# Patient Record
Sex: Male | Born: 1940 | Race: White | Hispanic: No | Smoking: Never smoker
Health system: Southern US, Community
[De-identification: ages and names within clinical notes are randomized; demographics above are authoritative.]

## PROBLEM LIST (undated history)

## (undated) DIAGNOSIS — I10 Essential (primary) hypertension: Secondary | ICD-10-CM

## (undated) DIAGNOSIS — M1990 Unspecified osteoarthritis, unspecified site: Secondary | ICD-10-CM

## (undated) DIAGNOSIS — R0683 Snoring: Secondary | ICD-10-CM

## (undated) DIAGNOSIS — K259 Gastric ulcer, unspecified as acute or chronic, without hemorrhage or perforation: Secondary | ICD-10-CM

## (undated) DIAGNOSIS — E119 Type 2 diabetes mellitus without complications: Secondary | ICD-10-CM

## (undated) DIAGNOSIS — M109 Gout, unspecified: Secondary | ICD-10-CM

## (undated) HISTORY — PX: ESOPHAGOGASTRODUODENOSCOPY: SHX1529

## (undated) HISTORY — PX: TONSILLECTOMY: SUR1361

## (undated) HISTORY — PX: SHOULDER SURGERY: SHX246

## (undated) HISTORY — PX: COLONOSCOPY W/ POLYPECTOMY: SHX1380

## (undated) HISTORY — PX: KNEE ARTHROPLASTY: SHX992

## (undated) HISTORY — PX: OTHER SURGICAL HISTORY: SHX169

---

## 2014-09-19 ENCOUNTER — Other Ambulatory Visit: Payer: Self-pay | Admitting: Orthopedic Surgery

## 2014-09-28 ENCOUNTER — Inpatient Hospital Stay (HOSPITAL_COMMUNITY)
Admission: RE | Admit: 2014-09-28 | Discharge: 2014-09-28 | Disposition: A | Discharge status: Home / Self Care | Payer: Self-pay | Source: Ambulatory Visit

## 2014-09-28 NOTE — Progress Notes (Signed)
Nurse called patient to inquire about scheduled 1300 PAT appointment. No answer. Direct call back number left.

## 2014-09-28 NOTE — Progress Notes (Signed)
Patient returned call and stated that he was not aware that he had a appointment today. Nurse [facility], multimedia(PAT secretary) to reschedule.

## 2014-09-29 ENCOUNTER — Encounter (HOSPITAL_COMMUNITY): Payer: Self-pay

## 2014-09-29 ENCOUNTER — Encounter (HOSPITAL_COMMUNITY)
Admission: RE | Admit: 2014-09-29 | Discharge: 2014-09-29 | Disposition: A | Discharge status: Home / Self Care | Payer: Medicare Other | Source: Ambulatory Visit | Attending: Orthopedic Surgery | Admitting: Orthopedic Surgery

## 2014-09-29 ENCOUNTER — Ambulatory Visit (HOSPITAL_COMMUNITY)
Admission: RE | Admit: 2014-09-29 | Discharge: 2014-09-29 | Disposition: A | Discharge status: Home / Self Care | Payer: Medicare Other | Source: Ambulatory Visit | Attending: Orthopedic Surgery | Admitting: Orthopedic Surgery

## 2014-09-29 DIAGNOSIS — Z01818 Encounter for other preprocedural examination: Secondary | ICD-10-CM

## 2014-09-29 HISTORY — DX: Gout, unspecified: M10.9

## 2014-09-29 HISTORY — DX: Unspecified osteoarthritis, unspecified site: M19.90

## 2014-09-29 HISTORY — DX: Gastric ulcer, unspecified as acute or chronic, without hemorrhage or perforation: K25.9

## 2014-09-29 HISTORY — DX: Essential (primary) hypertension: I10

## 2014-09-29 HISTORY — DX: Type 2 diabetes mellitus without complications: E11.9

## 2014-09-29 HISTORY — DX: Snoring: R06.83

## 2014-09-29 LAB — COMPREHENSIVE METABOLIC PANEL
ALBUMIN: 3.8 g/dL (ref 3.5–5.0)
ALT: 65 U/L — ABNORMAL HIGH (ref 17–63)
AST: 48 U/L — AB (ref 15–41)
Alkaline Phosphatase: 55 U/L (ref 38–126)
Anion gap: 11 (ref 5–15)
BILIRUBIN TOTAL: 1.5 mg/dL — AB (ref 0.3–1.2)
BUN: 23 mg/dL — ABNORMAL HIGH (ref 6–20)
CHLORIDE: 99 mmol/L — AB (ref 101–111)
CO2: 22 mmol/L (ref 22–32)
Calcium: 8.6 mg/dL — ABNORMAL LOW (ref 8.9–10.3)
Creatinine, Ser: 1.22 mg/dL (ref 0.61–1.24)
GFR, EST NON AFRICAN AMERICAN: 56 mL/min — AB (ref >60)
GLUCOSE: 348 mg/dL — AB (ref 65–99)
Potassium: 5.3 mmol/L — ABNORMAL HIGH (ref 3.5–5.1)
Sodium: 132 mmol/L — ABNORMAL LOW (ref 135–145)
Total Protein: 7 g/dL (ref 6.5–8.1)

## 2014-09-29 LAB — CBC WITH DIFFERENTIAL/PLATELET
BASOS PCT: 0 % (ref 0–1)
Basophils Absolute: 0 10*3/uL (ref 0.0–0.1)
EOS ABS: 0.4 10*3/uL (ref 0.0–0.7)
EOS PCT: 4 % (ref 0–5)
HEMATOCRIT: 40.3 % (ref 39.0–52.0)
Hemoglobin: 14.3 g/dL (ref 13.0–17.0)
Lymphocytes Relative: 20 % (ref 12–46)
Lymphs Abs: 1.9 10*3/uL (ref 0.7–4.0)
MCH: 32.4 pg (ref 26.0–34.0)
MCHC: 35.5 g/dL (ref 30.0–36.0)
MCV: 91.4 fL (ref 78.0–100.0)
MONO ABS: 0.8 10*3/uL (ref 0.1–1.0)
Monocytes Relative: 8 % (ref 3–12)
NEUTROS ABS: 6.6 10*3/uL (ref 1.7–7.7)
Neutrophils Relative %: 68 % (ref 43–77)
PLATELETS: ADEQUATE 10*3/uL (ref 150–400)
RBC: 4.41 MIL/uL (ref 4.22–5.81)
RDW: 12.7 % (ref 11.5–15.5)
WBC: 9.7 10*3/uL (ref 4.0–10.5)

## 2014-09-29 LAB — SURGICAL PCR SCREEN
MRSA, PCR: NEGATIVE
STAPHYLOCOCCUS AUREUS: NEGATIVE

## 2014-09-29 LAB — APTT: aPTT: 26 seconds (ref 24–37)

## 2014-09-29 LAB — URINE MICROSCOPIC-ADD ON

## 2014-09-29 LAB — URINALYSIS, ROUTINE W REFLEX MICROSCOPIC
Bilirubin Urine: NEGATIVE
Glucose, UA: >1000 mg/dL — AB
Hgb urine dipstick: NEGATIVE
Ketones, ur: NEGATIVE mg/dL
Leukocytes, UA: NEGATIVE
NITRITE: NEGATIVE
Protein, ur: NEGATIVE mg/dL
SPECIFIC GRAVITY, URINE: 1.029 (ref 1.005–1.030)
UROBILINOGEN UA: 0.2 mg/dL (ref 0.0–1.0)
pH: 5.5 (ref 5.0–8.0)

## 2014-09-29 LAB — PROTIME-INR
INR: 1.04 (ref 0.00–1.49)
PROTHROMBIN TIME: 13.7 s (ref 11.6–15.2)

## 2014-09-29 NOTE — Progress Notes (Signed)
PCP is Kathlee NationsPaul Eason and Cardiologist is Winifred Oliveaul Kirkman. Patient informed Nurse that he had a  EKG done this month with D.r Cory RoughenKirkman. Will request EKG, stress test results, and LOV. Patient denied having any acute cardiac or pulmonary issues.   Patient informed Nurse that his fasting blood glucose ranges between 60s to 170s, and his last hemoglobin A1C was 7.0.

## 2014-09-30 LAB — URINE CULTURE
COLONY COUNT: NO GROWTH
Culture: NO GROWTH

## 2014-10-02 NOTE — Progress Notes (Addendum)
Anesthesia Chart Review:  Pt is 74 year old male scheduled for R total knee arthroplasty on 10/09/2014 with Dr. Sherlean FootLucey.   PCP is Dr. Kathlee NationsPaul Eason. Cardiologist is [facility] (see care everywhere).   PMH includes: HTN, DM. By notes, mild carotid plaque bilaterally. Never smoker. BMI 33.  Preoperative labs reviewed.  Glucose 348. Pt reported at PAT fasting blood sugars usually range 60-170's, and last HgbA1c was 7.0 (03/10/2014). Spoke with pt on telephone, today's fasting blood sugar was 155. Pt reports is taking all meds and will work harder on diet. Let pt know surgery could be cancelled if blood sugar >200 DOS.   Chest x-ray reviewed. No active cardiopulmonary disease.   EKG 09/15/2014: Sinus rhythm.    Pt has cardiac clearance from Dr. Cory RoughenKirkman for surgery.   If blood glucose acceptable DOS, I anticipate pt can proceed as scheduled.   Rica Mastngela Akram Kissick, FNP-BC [facility]Anesthesiology Phone: [phone] 10/03/2014 3:43 PM

## 2014-10-06 MED ORDER — TRANEXAMIC ACID 1000 MG/10ML IV SOLN
1000.0000 mg | INTRAVENOUS | Status: AC
  Administered 2014-10-09: 1000 mg via INTRAVENOUS
  Filled 2014-10-06 (×2): qty 10

## 2014-10-06 MED ORDER — BUPIVACAINE LIPOSOME 1.3 % IJ SUSP
20.0000 mL | Freq: Once | INTRAMUSCULAR | Status: DC
  Filled 2014-10-06 (×2): qty 20

## 2014-10-09 ENCOUNTER — Inpatient Hospital Stay (HOSPITAL_COMMUNITY): Payer: Medicare Other | Admitting: Emergency Medicine

## 2014-10-09 ENCOUNTER — Inpatient Hospital Stay (HOSPITAL_COMMUNITY)
Admission: RE | Admit: 2014-10-09 | Discharge: 2014-10-11 | DRG: 470 | Disposition: A | Discharge status: Home Health | Payer: Medicare Other | Source: Ambulatory Visit | Attending: Orthopedic Surgery | Admitting: Orthopedic Surgery

## 2014-10-09 ENCOUNTER — Inpatient Hospital Stay (HOSPITAL_COMMUNITY): Payer: Medicare Other | Admitting: Certified Registered Nurse Anesthetist

## 2014-10-09 ENCOUNTER — Encounter (HOSPITAL_COMMUNITY)
Admission: RE | Disposition: A | Discharge status: Home Health | Payer: Self-pay | Source: Ambulatory Visit | Attending: Orthopedic Surgery

## 2014-10-09 ENCOUNTER — Encounter (HOSPITAL_COMMUNITY): Payer: Self-pay | Admitting: General Practice

## 2014-10-09 DIAGNOSIS — D62 Acute posthemorrhagic anemia: Secondary | ICD-10-CM | POA: Diagnosis not present

## 2014-10-09 DIAGNOSIS — Z96659 Presence of unspecified artificial knee joint: Secondary | ICD-10-CM

## 2014-10-09 DIAGNOSIS — M109 Gout, unspecified: Secondary | ICD-10-CM | POA: Diagnosis present

## 2014-10-09 DIAGNOSIS — Z7982 Long term (current) use of aspirin: Secondary | ICD-10-CM

## 2014-10-09 DIAGNOSIS — E119 Type 2 diabetes mellitus without complications: Secondary | ICD-10-CM | POA: Diagnosis present

## 2014-10-09 DIAGNOSIS — M1711 Unilateral primary osteoarthritis, right knee: Principal | ICD-10-CM | POA: Diagnosis present

## 2014-10-09 DIAGNOSIS — I10 Essential (primary) hypertension: Secondary | ICD-10-CM | POA: Diagnosis present

## 2014-10-09 DIAGNOSIS — Z96652 Presence of left artificial knee joint: Secondary | ICD-10-CM | POA: Diagnosis present

## 2014-10-09 DIAGNOSIS — M25561 Pain in right knee: Secondary | ICD-10-CM | POA: Diagnosis present

## 2014-10-09 HISTORY — PX: TOTAL KNEE ARTHROPLASTY: SHX125

## 2014-10-09 LAB — GLUCOSE, CAPILLARY
GLUCOSE-CAPILLARY: 213 mg/dL — AB (ref 65–99)
GLUCOSE-CAPILLARY: 199 mg/dL — AB (ref 65–99)
GLUCOSE-CAPILLARY: 266 mg/dL — AB (ref 65–99)

## 2014-10-09 SURGERY — ARTHROPLASTY, KNEE, TOTAL
Anesthesia: Regional | Site: Knee | Laterality: Right

## 2014-10-09 MED ORDER — SODIUM CHLORIDE 0.9 % IR SOLN
Status: DC | PRN
  Administered 2014-10-09: 1000 mL

## 2014-10-09 MED ORDER — ALUM & MAG HYDROXIDE-SIMETH 200-200-20 MG/5ML PO SUSP: 30.0000 mL | ORAL | Status: DC | PRN

## 2014-10-09 MED ORDER — PANTOPRAZOLE SODIUM 40 MG PO TBEC
40.0000 mg | DELAYED_RELEASE_TABLET | Freq: Every day | ORAL | Status: DC
Start: 2014-10-10 — End: 2014-10-11
  Administered 2014-10-10 – 2014-10-11 (×2): 40 mg via ORAL
  Filled 2014-10-09 (×2): qty 1

## 2014-10-09 MED ORDER — METHOCARBAMOL 500 MG PO TABS
500.0000 mg | ORAL_TABLET | Freq: Four times a day (QID) | ORAL | Status: DC | PRN
  Administered 2014-10-09 – 2014-10-10 (×2): 500 mg via ORAL
  Filled 2014-10-09 (×2): qty 1

## 2014-10-09 MED ORDER — SIMVASTATIN 10 MG PO TABS
10.0000 mg | ORAL_TABLET | Freq: Every day | ORAL | Status: DC
  Administered 2014-10-09 – 2014-10-10 (×2): 10 mg via ORAL
  Filled 2014-10-09 (×2): qty 1

## 2014-10-09 MED ORDER — PROBENECID 500 MG PO TABS
500.0000 mg | ORAL_TABLET | Freq: Every day | ORAL | Status: DC
  Administered 2014-10-10 – 2014-10-11 (×2): 500 mg via ORAL
  Filled 2014-10-09 (×2): qty 1

## 2014-10-09 MED ORDER — ACETAMINOPHEN 325 MG PO TABS
650.0000 mg | ORAL_TABLET | Freq: Four times a day (QID) | ORAL | Status: DC | PRN
  Administered 2014-10-09: 650 mg via ORAL
  Filled 2014-10-09 (×2): qty 2

## 2014-10-09 MED ORDER — PROPOFOL 10 MG/ML IV BOLUS
INTRAVENOUS | Status: AC
  Filled 2014-10-09: qty 20

## 2014-10-09 MED ORDER — ACETAMINOPHEN 650 MG RE SUPP
650.0000 mg | Freq: Four times a day (QID) | RECTAL | Status: DC | PRN
  Filled 2014-10-09: qty 1

## 2014-10-09 MED ORDER — BUPIVACAINE LIPOSOME 1.3 % IJ SUSP
INTRAMUSCULAR | Status: DC | PRN
  Administered 2014-10-09: 20 mL

## 2014-10-09 MED ORDER — FENTANYL CITRATE (PF) 250 MCG/5ML IJ SOLN
INTRAMUSCULAR | Status: AC
  Filled 2014-10-09: qty 5

## 2014-10-09 MED ORDER — METOCLOPRAMIDE HCL 5 MG PO TABS: 5.0000 mg | ORAL_TABLET | Freq: Three times a day (TID) | ORAL | Status: DC | PRN

## 2014-10-09 MED ORDER — PHENYLEPHRINE HCL 10 MG/ML IJ SOLN
INTRAMUSCULAR | Status: DC | PRN
  Administered 2014-10-09 (×6): 40 ug via INTRAVENOUS
  Administered 2014-10-09: 80 ug via INTRAVENOUS

## 2014-10-09 MED ORDER — OXYCODONE HCL 5 MG PO TABS
5.0000 mg | ORAL_TABLET | ORAL | Status: DC | PRN
  Administered 2014-10-09: 10 mg via ORAL
  Administered 2014-10-10: 5 mg via ORAL
  Administered 2014-10-10: 10 mg via ORAL
  Filled 2014-10-09 (×3): qty 2

## 2014-10-09 MED ORDER — ONDANSETRON HCL 4 MG/2ML IJ SOLN
INTRAMUSCULAR | Status: AC
  Administered 2014-10-09: 4 mg via INTRAVENOUS
  Filled 2014-10-09: qty 2

## 2014-10-09 MED ORDER — OXYCODONE HCL 5 MG PO TABS
ORAL_TABLET | ORAL | Status: AC
  Filled 2014-10-09: qty 2

## 2014-10-09 MED ORDER — CEFAZOLIN SODIUM-DEXTROSE 2-3 GM-% IV SOLR
2.0000 g | Freq: Four times a day (QID) | INTRAVENOUS | Status: AC
  Administered 2014-10-09 (×2): 2 g via INTRAVENOUS
  Filled 2014-10-09: qty 50

## 2014-10-09 MED ORDER — ONDANSETRON HCL 4 MG/2ML IJ SOLN
INTRAMUSCULAR | Status: AC
  Filled 2014-10-09: qty 2

## 2014-10-09 MED ORDER — CEFAZOLIN SODIUM-DEXTROSE 2-3 GM-% IV SOLR
INTRAVENOUS | Status: AC
  Administered 2014-10-09: 2 g via INTRAVENOUS
  Filled 2014-10-09: qty 50

## 2014-10-09 MED ORDER — ARTIFICIAL TEARS OP OINT
TOPICAL_OINTMENT | OPHTHALMIC | Status: AC
  Filled 2014-10-09: qty 3.5

## 2014-10-09 MED ORDER — BISACODYL 5 MG PO TBEC: 5.0000 mg | DELAYED_RELEASE_TABLET | Freq: Every day | ORAL | Status: DC | PRN

## 2014-10-09 MED ORDER — FENTANYL CITRATE (PF) 100 MCG/2ML IJ SOLN
INTRAMUSCULAR | Status: AC
  Filled 2014-10-09: qty 2

## 2014-10-09 MED ORDER — LIDOCAINE HCL (CARDIAC) 20 MG/ML IV SOLN
INTRAVENOUS | Status: DC | PRN
  Administered 2014-10-09: 40 mg via INTRAVENOUS

## 2014-10-09 MED ORDER — OXYCODONE HCL ER 10 MG PO T12A
10.0000 mg | EXTENDED_RELEASE_TABLET | Freq: Two times a day (BID) | ORAL | Status: DC
  Administered 2014-10-09 – 2014-10-11 (×4): 10 mg via ORAL
  Filled 2014-10-09 (×6): qty 1

## 2014-10-09 MED ORDER — STERILE WATER FOR INJECTION IJ SOLN
INTRAMUSCULAR | Status: AC
  Filled 2014-10-09: qty 10

## 2014-10-09 MED ORDER — CHLORHEXIDINE GLUCONATE 4 % EX LIQD
60.0000 mL | Freq: Once | CUTANEOUS | Status: DC
  Filled 2014-10-09: qty 60

## 2014-10-09 MED ORDER — DIPHENHYDRAMINE HCL 12.5 MG/5ML PO ELIX: 12.5000 mg | ORAL_SOLUTION | ORAL | Status: DC | PRN

## 2014-10-09 MED ORDER — SENNOSIDES-DOCUSATE SODIUM 8.6-50 MG PO TABS: 1.0000 | ORAL_TABLET | Freq: Every evening | ORAL | Status: DC | PRN

## 2014-10-09 MED ORDER — LIDOCAINE HCL (CARDIAC) 20 MG/ML IV SOLN
INTRAVENOUS | Status: AC
  Filled 2014-10-09: qty 10

## 2014-10-09 MED ORDER — DEXAMETHASONE SODIUM PHOSPHATE 4 MG/ML IJ SOLN
INTRAMUSCULAR | Status: AC
  Filled 2014-10-09: qty 1

## 2014-10-09 MED ORDER — EPHEDRINE SULFATE 50 MG/ML IJ SOLN
INTRAMUSCULAR | Status: DC | PRN
  Administered 2014-10-09 (×3): 5 mg via INTRAVENOUS

## 2014-10-09 MED ORDER — METHOCARBAMOL 1000 MG/10ML IJ SOLN
500.0000 mg | Freq: Four times a day (QID) | INTRAVENOUS | Status: DC | PRN
  Filled 2014-10-09: qty 5

## 2014-10-09 MED ORDER — QUINAPRIL HCL 10 MG PO TABS
40.0000 mg | ORAL_TABLET | Freq: Two times a day (BID) | ORAL | Status: DC
  Administered 2014-10-09 – 2014-10-11 (×4): 40 mg via ORAL
  Filled 2014-10-09 (×5): qty 4

## 2014-10-09 MED ORDER — AMLODIPINE BESYLATE 2.5 MG PO TABS
2.5000 mg | ORAL_TABLET | Freq: Every day | ORAL | Status: DC
  Administered 2014-10-10 – 2014-10-11 (×2): 2.5 mg via ORAL
  Filled 2014-10-09 (×2): qty 1

## 2014-10-09 MED ORDER — METOCLOPRAMIDE HCL 5 MG/ML IJ SOLN: 5.0000 mg | Freq: Three times a day (TID) | INTRAMUSCULAR | Status: DC | PRN

## 2014-10-09 MED ORDER — EPHEDRINE SULFATE 50 MG/ML IJ SOLN
INTRAMUSCULAR | Status: AC
  Filled 2014-10-09: qty 1

## 2014-10-09 MED ORDER — DOCUSATE SODIUM 100 MG PO CAPS
100.0000 mg | ORAL_CAPSULE | Freq: Two times a day (BID) | ORAL | Status: DC
  Administered 2014-10-09 – 2014-10-11 (×4): 100 mg via ORAL
  Filled 2014-10-09 (×4): qty 1

## 2014-10-09 MED ORDER — ZOLPIDEM TARTRATE 5 MG PO TABS: 5.0000 mg | ORAL_TABLET | Freq: Every evening | ORAL | Status: DC | PRN

## 2014-10-09 MED ORDER — BUPIVACAINE-EPINEPHRINE 0.5% -1:200000 IJ SOLN
INTRAMUSCULAR | Status: DC | PRN
  Administered 2014-10-09: 30 mL

## 2014-10-09 MED ORDER — ACETAMINOPHEN 325 MG PO TABS
ORAL_TABLET | ORAL | Status: AC
  Filled 2014-10-09: qty 2

## 2014-10-09 MED ORDER — METHOCARBAMOL 500 MG PO TABS
ORAL_TABLET | ORAL | Status: AC
  Filled 2014-10-09: qty 1

## 2014-10-09 MED ORDER — MENTHOL 3 MG MT LOZG: 1.0000 | LOZENGE | OROMUCOSAL | Status: DC | PRN

## 2014-10-09 MED ORDER — CEFAZOLIN SODIUM-DEXTROSE 2-3 GM-% IV SOLR
2.0000 g | INTRAVENOUS | Status: AC
  Administered 2014-10-09: 2 g via INTRAVENOUS
  Filled 2014-10-09: qty 50

## 2014-10-09 MED ORDER — SODIUM CHLORIDE 0.9 % IV SOLN
INTRAVENOUS | Status: DC
  Administered 2014-10-09 – 2014-10-10 (×2): via INTRAVENOUS

## 2014-10-09 MED ORDER — PHENOL 1.4 % MT LIQD: 1.0000 | OROMUCOSAL | Status: DC | PRN

## 2014-10-09 MED ORDER — PHENYLEPHRINE 40 MCG/ML (10ML) SYRINGE FOR IV PUSH (FOR BLOOD PRESSURE SUPPORT)
PREFILLED_SYRINGE | INTRAVENOUS | Status: AC
  Filled 2014-10-09: qty 10

## 2014-10-09 MED ORDER — GLIMEPIRIDE 4 MG PO TABS
2.0000 mg | ORAL_TABLET | Freq: Every day | ORAL | Status: DC
Start: 2014-10-10 — End: 2014-10-11
  Administered 2014-10-10 – 2014-10-11 (×2): 2 mg via ORAL
  Filled 2014-10-09 (×2): qty 1

## 2014-10-09 MED ORDER — FLEET ENEMA 7-19 GM/118ML RE ENEM
1.0000 | ENEMA | Freq: Once | RECTAL | Status: AC | PRN
Start: 2014-10-09 — End: 2014-10-09

## 2014-10-09 MED ORDER — 0.9 % SODIUM CHLORIDE (POUR BTL) OPTIME
TOPICAL | Status: DC | PRN
  Administered 2014-10-09: 1000 mL

## 2014-10-09 MED ORDER — HYDROMORPHONE HCL 1 MG/ML IJ SOLN
INTRAMUSCULAR | Status: AC
  Administered 2014-10-09: 1 mg via INTRAVENOUS
  Filled 2014-10-09: qty 1

## 2014-10-09 MED ORDER — CELECOXIB 200 MG PO CAPS
200.0000 mg | ORAL_CAPSULE | Freq: Two times a day (BID) | ORAL | Status: DC
  Administered 2014-10-09 – 2014-10-11 (×4): 200 mg via ORAL
  Filled 2014-10-09 (×4): qty 1

## 2014-10-09 MED ORDER — SODIUM CHLORIDE 0.9 % IV SOLN: INTRAVENOUS | Status: DC

## 2014-10-09 MED ORDER — FENTANYL CITRATE (PF) 100 MCG/2ML IJ SOLN
100.0000 ug | Freq: Once | INTRAMUSCULAR | Status: AC
  Administered 2014-10-09: 100 ug via INTRAVENOUS

## 2014-10-09 MED ORDER — HYDROCHLOROTHIAZIDE 25 MG PO TABS
25.0000 mg | ORAL_TABLET | Freq: Every day | ORAL | Status: DC
  Administered 2014-10-10 – 2014-10-11 (×2): 25 mg via ORAL
  Filled 2014-10-09 (×2): qty 1

## 2014-10-09 MED ORDER — ENOXAPARIN SODIUM 30 MG/0.3ML ~~LOC~~ SOLN
30.0000 mg | Freq: Two times a day (BID) | SUBCUTANEOUS | Status: DC
  Administered 2014-10-10 – 2014-10-11 (×3): 30 mg via SUBCUTANEOUS
  Filled 2014-10-09 (×3): qty 0.3

## 2014-10-09 MED ORDER — LACTATED RINGERS IV SOLN
INTRAVENOUS | Status: DC
  Administered 2014-10-09 (×2): via INTRAVENOUS

## 2014-10-09 MED ORDER — SODIUM CHLORIDE 0.9 % IV SOLN: 1000.0000 mg | Freq: Once | INTRAVENOUS | Status: DC

## 2014-10-09 MED ORDER — HYDROMORPHONE HCL 1 MG/ML IJ SOLN
1.0000 mg | INTRAMUSCULAR | Status: DC | PRN
  Administered 2014-10-09 – 2014-10-10 (×4): 1 mg via INTRAVENOUS
  Filled 2014-10-09 (×2): qty 1

## 2014-10-09 MED ORDER — METFORMIN HCL ER 500 MG PO TB24
500.0000 mg | ORAL_TABLET | Freq: Every day | ORAL | Status: DC
  Administered 2014-10-10 – 2014-10-11 (×2): 500 mg via ORAL
  Filled 2014-10-09 (×2): qty 1

## 2014-10-09 MED ORDER — PROPOFOL INFUSION 10 MG/ML OPTIME
INTRAVENOUS | Status: DC | PRN
  Administered 2014-10-09: 50 ug/kg/min via INTRAVENOUS

## 2014-10-09 MED ORDER — DUTASTERIDE 0.5 MG PO CAPS
0.5000 mg | ORAL_CAPSULE | Freq: Every day | ORAL | Status: DC
  Administered 2014-10-10 – 2014-10-11 (×2): 0.5 mg via ORAL
  Filled 2014-10-09 (×2): qty 1

## 2014-10-09 MED ORDER — SUCCINYLCHOLINE CHLORIDE 20 MG/ML IJ SOLN
INTRAMUSCULAR | Status: AC
  Filled 2014-10-09: qty 1

## 2014-10-09 MED ORDER — ONDANSETRON HCL 4 MG PO TABS: 4.0000 mg | ORAL_TABLET | Freq: Four times a day (QID) | ORAL | Status: DC | PRN

## 2014-10-09 MED ORDER — FENTANYL CITRATE (PF) 100 MCG/2ML IJ SOLN
INTRAMUSCULAR | Status: DC | PRN
  Administered 2014-10-09: 50 ug via INTRAVENOUS

## 2014-10-09 MED ORDER — ONDANSETRON HCL 4 MG/2ML IJ SOLN
INTRAMUSCULAR | Status: DC | PRN
  Administered 2014-10-09: 4 mg via INTRAVENOUS

## 2014-10-09 MED ORDER — HYDROMORPHONE HCL 1 MG/ML IJ SOLN
INTRAMUSCULAR | Status: AC
  Filled 2014-10-09: qty 1

## 2014-10-09 MED ORDER — ONDANSETRON HCL 4 MG/2ML IJ SOLN
4.0000 mg | Freq: Four times a day (QID) | INTRAMUSCULAR | Status: DC | PRN
  Administered 2014-10-09: 4 mg via INTRAVENOUS

## 2014-10-09 MED ORDER — INSULIN ASPART 100 UNIT/ML ~~LOC~~ SOLN
0.0000 [IU] | Freq: Three times a day (TID) | SUBCUTANEOUS | Status: DC
  Administered 2014-10-10 (×2): 3 [IU] via SUBCUTANEOUS
  Administered 2014-10-10: 5 [IU] via SUBCUTANEOUS
  Administered 2014-10-11: 8 [IU] via SUBCUTANEOUS

## 2014-10-09 SURGICAL SUPPLY — 62 items
BANDAGE ELASTIC 6 VELCRO ST LF (GAUZE/BANDAGES/DRESSINGS) ×2 IMPLANT
BANDAGE ESMARK 6X9 LF (GAUZE/BANDAGES/DRESSINGS) ×1 IMPLANT
BLADE SAGITTAL 13X1.27X60 (BLADE) ×2 IMPLANT
BLADE SAW SGTL 83.5X18.5 (BLADE) ×2 IMPLANT
BLADE SURG 10 STRL SS (BLADE) ×2 IMPLANT
BNDG ESMARK 6X9 LF (GAUZE/BANDAGES/DRESSINGS) ×2
BOWL SMART MIX CTS (DISPOSABLE) ×2 IMPLANT
CAPT KNEE TOTAL 3 ×2 IMPLANT
CEMENT BONE SIMPLEX SPEEDSET (Cement) ×4 IMPLANT
COVER SURGICAL LIGHT HANDLE (MISCELLANEOUS) ×2 IMPLANT
CUFF TOURNIQUET SINGLE 34IN LL (TOURNIQUET CUFF) ×2 IMPLANT
DRAPE EXTREMITY T 121X128X90 (DRAPE) ×2 IMPLANT
DRAPE IMP U-DRAPE 54X76 (DRAPES) ×2 IMPLANT
DRAPE INCISE IOBAN 66X45 STRL (DRAPES) ×4 IMPLANT
DRAPE PROXIMA HALF (DRAPES) IMPLANT
DRAPE U-SHAPE 47X51 STRL (DRAPES) ×2 IMPLANT
DRSG ADAPTIC 3X8 NADH LF (GAUZE/BANDAGES/DRESSINGS) ×2 IMPLANT
DRSG PAD ABDOMINAL 8X10 ST (GAUZE/BANDAGES/DRESSINGS) ×2 IMPLANT
DURAPREP 26ML APPLICATOR (WOUND CARE) ×4 IMPLANT
ELECT REM PT RETURN 9FT ADLT (ELECTROSURGICAL) ×2
ELECTRODE REM PT RTRN 9FT ADLT (ELECTROSURGICAL) ×1 IMPLANT
GAUZE SPONGE 4X4 12PLY STRL (GAUZE/BANDAGES/DRESSINGS) ×2 IMPLANT
GLOVE BIOGEL M 7.0 STRL (GLOVE) IMPLANT
GLOVE BIOGEL PI IND STRL 6.5 (GLOVE) ×1 IMPLANT
GLOVE BIOGEL PI IND STRL 7.5 (GLOVE) IMPLANT
GLOVE BIOGEL PI IND STRL 8.5 (GLOVE) ×2 IMPLANT
GLOVE BIOGEL PI INDICATOR 6.5 (GLOVE) ×1
GLOVE BIOGEL PI INDICATOR 7.5 (GLOVE)
GLOVE BIOGEL PI INDICATOR 8.5 (GLOVE) ×2
GLOVE SURG ORTHO 8.0 STRL STRW (GLOVE) ×4 IMPLANT
GLOVE SURG SS PI 6.5 STRL IVOR (GLOVE) ×2 IMPLANT
GOWN STRL REUS W/ TWL LRG LVL3 (GOWN DISPOSABLE) ×1 IMPLANT
GOWN STRL REUS W/ TWL XL LVL3 (GOWN DISPOSABLE) ×2 IMPLANT
GOWN STRL REUS W/TWL LRG LVL3 (GOWN DISPOSABLE) ×1
GOWN STRL REUS W/TWL XL LVL3 (GOWN DISPOSABLE) ×2
HANDPIECE INTERPULSE COAX TIP (DISPOSABLE) ×1
HOOD PEEL AWAY FACE SHEILD DIS (HOOD) ×6 IMPLANT
KIT BASIN OR (CUSTOM PROCEDURE TRAY) ×2 IMPLANT
KIT ROOM TURNOVER OR (KITS) ×2 IMPLANT
KNEE CAPITATED TOTAL 3 ×1 IMPLANT
MANIFOLD NEPTUNE II (INSTRUMENTS) ×2 IMPLANT
NEEDLE 22X1 1/2 (OR ONLY) (NEEDLE) ×4 IMPLANT
NS IRRIG 1000ML POUR BTL (IV SOLUTION) ×2 IMPLANT
PACK TOTAL JOINT (CUSTOM PROCEDURE TRAY) ×2 IMPLANT
PACK UNIVERSAL I (CUSTOM PROCEDURE TRAY) ×2 IMPLANT
PAD ARMBOARD 7.5X6 YLW CONV (MISCELLANEOUS) ×4 IMPLANT
PADDING CAST ABS 6INX4YD NS (CAST SUPPLIES) ×1
PADDING CAST ABS COTTON 6X4 NS (CAST SUPPLIES) ×1 IMPLANT
PADDING CAST COTTON 6X4 STRL (CAST SUPPLIES) ×2 IMPLANT
SET HNDPC FAN SPRY TIP SCT (DISPOSABLE) ×1 IMPLANT
STAPLER VISISTAT 35W (STAPLE) ×2 IMPLANT
SUCTION FRAZIER TIP 10 FR DISP (SUCTIONS) ×2 IMPLANT
SUT BONE WAX W31G (SUTURE) ×2 IMPLANT
SUT VIC AB 0 CTB1 27 (SUTURE) ×4 IMPLANT
SUT VIC AB 1 CT1 27 (SUTURE) ×2
SUT VIC AB 1 CT1 27XBRD ANBCTR (SUTURE) ×2 IMPLANT
SUT VIC AB 2-0 CT1 27 (SUTURE) ×2
SUT VIC AB 2-0 CT1 TAPERPNT 27 (SUTURE) ×2 IMPLANT
SYR 20CC LL (SYRINGE) ×4 IMPLANT
TOWEL OR 17X24 6PK STRL BLUE (TOWEL DISPOSABLE) ×2 IMPLANT
TOWEL OR 17X26 10 PK STRL BLUE (TOWEL DISPOSABLE) ×2 IMPLANT
WATER STERILE IRR 1000ML POUR (IV SOLUTION) ×2 IMPLANT

## 2014-10-09 NOTE — Anesthesia Postprocedure Evaluation (Signed)
  Anesthesia Post-op Note  Patient: Stephen Parks  Procedure(s) Performed: Procedure(s) with comments: RIGHT TOTAL KNEE ARTHROPLASTY (Right) - Choice anesthesia with adductor canal block  Patient Location: PACU  Anesthesia Type:General  Level of Consciousness: awake, alert , oriented and patient cooperative  Airway and Oxygen Therapy: Patient Spontanous Breathing  Post-op Pain: mild  Post-op Assessment: Post-op Vital signs reviewed, Patient's Cardiovascular Status Stable, Respiratory Function Stable, Patent Airway, No signs of Nausea or vomiting and Pain level controlled  Post-op Vital Signs: stable  Last Vitals:  Filed Vitals:   10/09/14 1142  BP: 118/52  Pulse: 74  Temp: 36.4 C  Resp: 17    Complications: No apparent anesthesia complications

## 2014-10-09 NOTE — Transfer of Care (Signed)
Immediate Anesthesia Transfer of Care Note  Patient: Stephen Parks  Procedure(s) Performed: Procedure(s) with comments: RIGHT TOTAL KNEE ARTHROPLASTY (Right) - Choice anesthesia with adductor canal block  Patient Location: PACU  Anesthesia Type:MAC combined with regional for post-op pain  Level of Consciousness: awake, alert  and oriented  Airway & Oxygen Therapy: Patient Spontanous Breathing and Patient connected to nasal cannula oxygen  Post-op Assessment: Report given to RN and Post -op Vital signs reviewed and stable  Post vital signs: Reviewed and stable  Last Vitals:  Filed Vitals:   10/09/14 1142  BP: 118/52  Pulse: 74  Temp: 36.4 C  Resp: 17    Complications: No apparent anesthesia complications

## 2014-10-09 NOTE — Progress Notes (Signed)
Pt. Has abrasion on right hand with band-aid.

## 2014-10-09 NOTE — H&P (Signed)
Stephen Parks MRN:  [id number] DOB/SEX:  1940/06/22/male  CHIEF COMPLAINT:  Painful right Knee  HISTORY: Patient is a 74 y.o. male presented with a history of pain in the right knee. Onset of symptoms was gradual starting several years ago with gradually worsening course since that time. Prior procedures on the knee include none. Patient has been treated conservatively with over-the-counter NSAIDs and activity modification. Patient currently rates pain in the knee at 10 out of 10 with activity. There is pain at night.  PAST MEDICAL HISTORY: There are no active problems to display for this patient.  Past Medical History  Diagnosis Date  . Stomach ulcer   . Snoring   . Hypertension   . Diabetes mellitus without complication     Type 2  . Gout   . Arthritis    Past Surgical History  Procedure Laterality Date  . Knee arthroplasty Left   . Colonoscopy w/ polypectomy    . Esophagogastroduodenoscopy    . Tonsillectomy    . Uvula removed    . Shoulder surgery Bilateral     Bone spurs removed     MEDICATIONS:   No prescriptions prior to admission    ALLERGIES:  No Known Allergies  REVIEW OF SYSTEMS:  A comprehensive review of systems was negative.   FAMILY HISTORY:  No family history on file.  SOCIAL HISTORY:   History  Substance Use Topics  . Smoking status: Never Smoker   . Smokeless tobacco: Not on file  . Alcohol Use: 0.6 - 1.2 oz/week    1-2 Glasses of wine per week     Comment: daily     EXAMINATION:  Vital signs in last 24 hours:    There were no vitals taken for this visit.  General Appearance:    Alert, cooperative, no distress, appears stated age  Head:    Normocephalic, without obvious abnormality, atraumatic  Eyes:    PERRL, conjunctiva/corneas clear, EOM's intact, fundi    benign, both eyes       Ears:    Normal TM's and external ear canals, both ears  Nose:   Nares normal, septum midline, mucosa normal, no drainage    or sinus tenderness   Throat:   Lips, mucosa, and tongue normal; teeth and gums normal  Neck:   Supple, symmetrical, trachea midline, no adenopathy;       thyroid:  No enlargement/tenderness/nodules; no carotid   bruit or JVD  Back:     Symmetric, no curvature, ROM normal, no CVA tenderness  Lungs:     Clear to auscultation bilaterally, respirations unlabored  Chest wall:    No tenderness or deformity  Heart:    Regular rate and rhythm, S1 and S2 normal, no murmur, rub   or gallop  Abdomen:     Soft, non-tender, bowel sounds active all four quadrants,    no masses, no organomegaly  Genitalia:    Normal male without lesion, discharge or tenderness  Rectal:    Normal tone, normal prostate, no masses or tenderness;   guaiac negative stool  Extremities:   Extremities normal, atraumatic, no cyanosis or edema  Pulses:   2+ and symmetric all extremities  Skin:   Skin color, texture, turgor normal, no rashes or lesions  Lymph nodes:   Cervical, supraclavicular, and axillary nodes normal  Neurologic:   CNII-XII intact. Normal strength, sensation and reflexes      throughout    Musculoskeletal:  ROM 0-115, Ligaments intact,  Imaging Review  Plain radiographs demonstrate severe degenerative joint disease of the right knee. The overall alignment is significant varus. The bone quality appears to be good for age and reported activity level.  Assessment/Plan: Primary osteoarthritis, right knee   The patient history, physical examination and imaging studies are consistent with advanced degenerative joint disease of the right knee. The patient has failed conservative treatment.  The clearance notes were reviewed.  After discussion with the patient it was felt that Total Knee Replacement was indicated. The procedure,  risks, and benefits of total knee arthroplasty were presented and reviewed. The risks including but not limited to aseptic loosening, infection, blood clots, vascular injury, stiffness, patella tracking problems  complications among others were discussed. The patient acknowledged the explanation, agreed to proceed with the plan.  Chana Lindstrom 10/09/2014, 6:58 AM

## 2014-10-09 NOTE — Anesthesia Preprocedure Evaluation (Signed)
Anesthesia Evaluation  Patient identified by MRN, date of birth, ID band Patient awake    Reviewed: Allergy & Precautions, NPO status , Patient's Chart, lab work & pertinent test results  Airway Mallampati: I       Dental   Pulmonary    Pulmonary exam normal       Cardiovascular hypertension, Rhythm:Regular Rate:Normal     Neuro/Psych    GI/Hepatic PUD,   Endo/Other  diabetes, Type 2  Renal/GU      Musculoskeletal  (+) Arthritis -,   Abdominal   Peds  Hematology   Anesthesia Other Findings   Reproductive/Obstetrics                             Anesthesia Physical Anesthesia Plan  ASA: II  Anesthesia Plan: General   Post-op Pain Management:    Induction: Intravenous  Airway Management Planned: Oral ETT and LMA  Additional Equipment:   Intra-op Plan:   Post-operative Plan: Extubation in OR  Informed Consent: I have reviewed the patients History and Physical, chart, labs and discussed the procedure including the risks, benefits and alternatives for the proposed anesthesia with the patient or authorized representative who has indicated his/her understanding and acceptance.     Plan Discussed with: Anesthesiologist, CRNA and Surgeon  Anesthesia Plan Comments:         Anesthesia Quick Evaluation

## 2014-10-09 NOTE — Progress Notes (Signed)
Orthopedic Tech Progress Note Patient Details:  Wandra Feinsteinelson F Kennan February 28, 1941 [id number] Viewed order from doctor's order list; ortho has no footsie roll currently; will provide when it's  Available; RN notified CPM Right Knee CPM Right Knee: On Right Knee Flexion (Degrees): 90 Right Knee Extension (Degrees): 0 Additional Comments: trapeze bar patient helper   Nikki DomCrawford, Olena Willy 10/09/2014, 12:29 PM

## 2014-10-09 NOTE — Anesthesia Procedure Notes (Addendum)
Anesthesia Regional Block:  Adductor canal block  Pre-Anesthetic Checklist: ,, timeout performed, Correct Patient, Correct Site, Correct Laterality, Correct Procedure, Correct Position, site marked, Risks and benefits discussed,  Surgical consent,  Pre-op evaluation,  At surgeon's request and post-op pain management  Laterality: Right  Prep: chloraprep and alcohol swabs       Needles:  Injection technique: Single-shot  Needle Type: Stimulator Needle - 80          Additional Needles:  Procedures: Doppler guided Adductor canal block Narrative:  Start time: 10/09/2014 9:30 AM End time: 10/09/2014 9:35 AM Injection made incrementally with aspirations every 5 mL.  Performed by: Personally  Anesthesiologist: Laverle HobbySMITH, GREGORY  Additional Notes: Pt accepts procedure w/ risks. 20cc 0.5% Marcaine w/ epi w/o difficulty or discomfort. GES   Spinal Patient location during procedure: OR Start time: 10/09/2014 9:50 AM End time: 10/09/2014 9:55 AM Staffing Anesthesiologist: Laverle HobbySMITH, GREGORY Performed by: anesthesiologist  Preanesthetic Checklist Completed: patient identified, site marked, surgical consent, pre-op evaluation, timeout performed, IV checked, risks and benefits discussed and monitors and equipment checked Spinal Block Patient position: sitting Prep: Betadine Patient monitoring: heart rate, cardiac monitor, continuous pulse ox and blood pressure Approach: midline Location: L3-4 Injection technique: single-shot Needle Needle type: Quincke  Needle gauge: 22 G Needle length: 9 cm Needle insertion depth: 7 cm Assessment Sensory level: T10 Additional Notes Pt accepts procedure w/ risks. 10mg  Marcaine w/o difficulty or discomfort. GES  Procedure Name: MAC Date/Time: 10/09/2014 9:45 AM Performed by: Margaree MackintoshYACOUB, Majesty Stehlin B Pre-anesthesia Checklist: Patient identified, Emergency Drugs available, Suction available, Patient being monitored and Timeout performed Patient  Re-evaluated:Patient Re-evaluated prior to inductionOxygen Delivery Method: Simple face mask Number of attempts: 1 Placement Confirmation: positive ETCO2 Dental Injury: Teeth and Oropharynx as per pre-operative assessment

## 2014-10-10 ENCOUNTER — Encounter (HOSPITAL_COMMUNITY): Payer: Self-pay | Admitting: Orthopedic Surgery

## 2014-10-10 LAB — GLUCOSE, CAPILLARY
GLUCOSE-CAPILLARY: 208 mg/dL — AB (ref 65–99)
GLUCOSE-CAPILLARY: 196 mg/dL — AB (ref 65–99)
Glucose-Capillary: 199 mg/dL — ABNORMAL HIGH (ref 65–99)
Glucose-Capillary: 195 mg/dL — ABNORMAL HIGH (ref 65–99)

## 2014-10-10 LAB — CBC
HEMATOCRIT: 37.6 % — AB (ref 39.0–52.0)
Hemoglobin: 12.7 g/dL — ABNORMAL LOW (ref 13.0–17.0)
MCH: 31.2 pg (ref 26.0–34.0)
MCHC: 33.8 g/dL (ref 30.0–36.0)
MCV: 92.4 fL (ref 78.0–100.0)
Platelets: 248 10*3/uL (ref 150–400)
RBC: 4.07 MIL/uL — AB (ref 4.22–5.81)
RDW: 12.9 % (ref 11.5–15.5)
WBC: 9.3 10*3/uL (ref 4.0–10.5)

## 2014-10-10 LAB — BASIC METABOLIC PANEL
ANION GAP: 8 (ref 5–15)
BUN: 16 mg/dL (ref 6–20)
CALCIUM: 8.5 mg/dL — AB (ref 8.9–10.3)
CO2: 26 mmol/L (ref 22–32)
Chloride: 100 mmol/L — ABNORMAL LOW (ref 101–111)
Creatinine, Ser: 1.44 mg/dL — ABNORMAL HIGH (ref 0.61–1.24)
GFR calc Af Amer: 53 mL/min — ABNORMAL LOW (ref >60)
GFR calc non Af Amer: 46 mL/min — ABNORMAL LOW (ref >60)
GLUCOSE: 216 mg/dL — AB (ref 65–99)
POTASSIUM: 4.2 mmol/L (ref 3.5–5.1)
Sodium: 134 mmol/L — ABNORMAL LOW (ref 135–145)

## 2014-10-10 NOTE — Progress Notes (Signed)
Occupational Therapy Evaluation Patient Details Name: Stephen Parks MRN: [id number] DOB: 12-03-1940 Today's Date: 10/10/2014    History of Present Illness Patient is a 74 y/o male s/p R TKA. PMH of HTN, gout and DM.   Clinical Impression   Patient independent PTA. Patient currently functioning at an overall supervision>mod assist level. Patient will benefit from acute OT to increase overall independence in the areas of ADLs, functional mobility, and overall safety in order to safely discharge home with 24/7 supervision/assistance for safety. Pt reports that he has hired caregivers to assist him.     Follow Up Recommendations  No OT follow up;Supervision/Assistance - 24 hour    Equipment Recommendations  Other (comment) (sock aid, LH sponge)    Recommendations for Other Services  None at this time   Precautions / Restrictions Precautions Precautions: Knee;Fall Precaution Comments: Reviewed no pillow under knee and HEP. Restrictions Weight Bearing Restrictions: Yes RLE Weight Bearing: Weight bearing as tolerated      Mobility Bed Mobility Overal bed mobility: Needs Assistance Bed Mobility: Sit to Supine       Sit to supine: Supervision   General bed mobility comments: Pt able to manage BLEs into bed. No cues needed, sueprvision for safety  Transfers Overall transfer level: Needs assistance Equipment used: Rolling walker (2 wheeled) Transfers: Sit to/from Stand Sit to Stand: Mod assist         General transfer comment: Cues needed for sequencing, safety, and hand placement. Pt stated he felt somewhat "fuzzy headed" and blamed this on medications?     Balance Overall balance assessment: Needs assistance Sitting-balance support: No upper extremity supported;Feet supported Sitting balance-Leahy Scale: Good     Standing balance support: Bilateral upper extremity supported;During functional activity Standing balance-Leahy Scale: Fair    ADL Overall ADL's :  Needs assistance/impaired General ADL Comments: Pt mod assist for sit<>stands and min guard for functional ambulation. Pt unable to reach BLEs for LB ADLs, educated pt on potential use of sock aid to assist with donning of socks. Pt with decreased activity toleance/endurance and states he thinks it's due to medication? Will follow pt acutely, but no follow-up recommended at this time.     Pertinent Vitals/Pain Pain Assessment: 0-10 Pain Score: 5  ("but going down") Pain Location: right knee Pain Descriptors / Indicators: Aching;Sore Pain Intervention(s): Monitored during session;Repositioned;Limited activity within patient's tolerance     Hand Dominance Right   Extremity/Trunk Assessment Upper Extremity Assessment Upper Extremity Assessment: Overall WFL for tasks assessed   Lower Extremity Assessment Lower Extremity Assessment: Defer to PT evaluation   Cervical / Trunk Assessment Cervical / Trunk Assessment: Normal   Communication Communication Communication: HOH   Cognition Arousal/Alertness: Awake/alert Behavior During Therapy: WFL for tasks assessed/performed Overall Cognitive Status: Within Functional Limits for tasks assessed              Home Living Family/patient expects to be discharged to:: Private residence Living Arrangements: Non-relatives/Friends;Children Available Help at Discharge: Personal care attendant;Available 24 hours/day Type of Home: House Home Access: Level entry     Home Layout: One level     Bathroom Shower/Tub: Producer, television/film/video: Handicapped height     Home Equipment: Environmental consultant - 2 wheels;Wheelchair - manual;Bedside commode   Prior Functioning/Environment Level of Independence: Independent        Comments: Pt reports having 24/7 care from hired nurses to help care for son with CP.    OT Diagnosis: Generalized weakness;Acute pain   OT Problem  List: Decreased strength;Decreased range of motion;Decreased activity  tolerance;Impaired balance (sitting and/or standing);Decreased safety awareness;Decreased knowledge of use of DME or AE;Decreased knowledge of precautions;Pain   OT Treatment/Interventions: Self-care/ADL training    OT Goals(Current goals can be found in the care plan section) Acute Rehab OT Goals Patient Stated Goal: to go home as soon as possible OT Goal Formulation: With patient Time For Goal Achievement: 10/17/14 Potential to Achieve Goals: Good ADL Goals Pt Will Perform Grooming: with modified independence;standing Pt Will Perform Lower Body Bathing: with modified independence;sit to/from stand;with adaptive equipment Pt Will Perform Lower Body Dressing: with modified independence;sit to/from stand;with adaptive equipment Pt Will Transfer to Toilet: with modified independence;ambulating;bedside commode Pt Will Perform Toileting - Clothing Manipulation and hygiene: with modified independence;sit to/from stand Pt Will Perform Tub/Shower Transfer: with modified independence;shower seat;rolling walker;ambulating;Shower transfer  OT Frequency: Min 2X/week   Barriers to D/C: Decreased caregiver support   End of Session Equipment Utilized During Treatment: Gait belt;Rolling walker CPM Right Knee CPM Right Knee: Off  Activity Tolerance: Patient tolerated treatment well Patient left: in bed;with call bell/phone within reach   Time: 1434-1453 OT Time Calculation (min): 19 min Charges:  OT General Charges $OT Visit: 1 Procedure OT Evaluation $Initial OT Evaluation Tier I: 1 Procedure  Mads Borgmeyer , MS, OTR/L, CLT Pager: [phone]  10/10/2014, 3:18 PM

## 2014-10-10 NOTE — Progress Notes (Signed)
Orthopedic Tech Progress Note Patient Details:  Stephen Parks 01/21/1941 [id number]  Ortho Devices Type of Ortho Device: Knee Immobilizer Ortho Device/Splint Location: RLE Ortho Device/Splint Interventions: Ordered, Application   Jennye MoccasinHughes, Oluwasemilore Pascuzzi Craig 10/10/2014, 9:27 PM

## 2014-10-10 NOTE — Progress Notes (Signed)
Bone foam ordered by MD.Paged ortho tech to deliver one.None available in hospital per ortho tech.

## 2014-10-10 NOTE — Progress Notes (Signed)
Patient lying flat in bed dangling operative leg off side of bed.Asked patient to placed his leg back in bed.Patient stated,"It hurts to keep my leg straight in bed." Educated patient that his leg needs to be straight in bed .

## 2014-10-10 NOTE — Care Management Note (Signed)
Case Management Note  Patient Details  Name: Stephen Parks MRN: [id number] Date of Birth: 09/01/1940  Subjective/Objective:   74 year old male admitted with osteoarWandra Feinsteinthritis of the right knee. Patient underwent a right total knee arthroplasty.        Action/Plan:  Case manager spoke with patient concerning Home Health and DME. Choice offered. Patient states [facility] . Case manager contacted [facility], spoke with Rebecca-[phone], demographics, H&P OP note, orders and face to face faxed to her at [phone]. Start of care will be Thursday, Oct 12, 2014.  Expected Discharge Date: 10/11/14                 Expected Discharge Plan:  Home w Home Health Services  In-House Referral:  NA  Discharge planning Services  CM Consult  Post Acute Care Choice:  Home Health Choice offered to:     DME Arranged:  3-N-1, CPM DME Agency:  [employer]  [facility]  [facility]  Status of Service:  Completed, signed off  Medicare Important Message Given:    Date Medicare IM Given:    Medicare IM give by:    Date Additional Medicare IM Given:    Additional Medicare Important Message give by:     If discussed at Long Length of Stay Meetings, dates discussed:    Additional Comments:  Durenda GuthrieBrady, Toniette Devera Naomi, RN 10/10/2014, 3:11 PM

## 2014-10-10 NOTE — Evaluation (Signed)
Physical Therapy Evaluation Patient Details Name: Stephen Parks MRN: [id number] DOB: 10-16-1940 Today's Date: 10/10/2014   History of Present Illness  Patient is a 74 y/o male s/p R TKA. PMH of HTN, gout and DM.  Clinical Impression  Patient presents with pain and post surgical deficits RLE s/p above surgery impacting mobility. Mobility assessment limited by drowsiness and pain. Tolerated short distance ambulation to chair with Min A but required Max A to stand. Pt reports having assist from nurses at home (son has CP and they are there 24/7). Need to determine if they can assist caring for him? Pt will need another day or to of PT to improve transfers, mobility and safety prior to discharge.     Follow Up Recommendations Home health PT;Supervision/Assistance - 24 hour    Equipment Recommendations  None recommended by PT    Recommendations for Other Services OT consult     Precautions / Restrictions Precautions Precautions: Knee;Fall Precaution Booklet Issued: Yes (comment) Precaution Comments: Reviewed no pillow under knee and HEP. Restrictions Weight Bearing Restrictions: Yes RLE Weight Bearing: Weight bearing as tolerated      Mobility  Bed Mobility Overal bed mobility: Needs Assistance Bed Mobility: Supine to Sit     Supine to sit: HOB elevated;Mod assist     General bed mobility comments: Mod A to elevate trunk to sitting position. Use of rails.   Transfers Overall transfer level: Needs assistance Equipment used: Rolling walker (2 wheeled) Transfers: Sit to/from Stand Sit to Stand: Max assist         General transfer comment: Max A to rise from EOB with cues for hand placement. + dizziness sitting EOB.   Ambulation/Gait Ambulation/Gait assistance: Min assist Ambulation Distance (Feet): 10 Feet Assistive device: Rolling walker (2 wheeled) Gait Pattern/deviations: Step-to pattern;Decreased stance time - right;Decreased step length - left;Trunk flexed    Gait velocity interpretation: Below normal speed for age/gender General Gait Details: Pt with slow, guarded gait. INcreased WB through BUEs. + grogginess.  Stairs            Wheelchair Mobility    Modified Rankin (Stroke Patients Only)       Balance Overall balance assessment: Needs assistance Sitting-balance support: Feet supported;No upper extremity supported Sitting balance-Leahy Scale: Fair     Standing balance support: During functional activity Standing balance-Leahy Scale: Poor Standing balance comment: Relient on RW. Requires Min A for balance and steadying self.                             Pertinent Vitals/Pain Pain Assessment: Faces Faces Pain Scale: Hurts little more Pain Location: right knee Pain Descriptors / Indicators: Sore;Aching Pain Intervention(s): Limited activity within patient's tolerance;Monitored during session;Repositioned    Home Living Family/patient expects to be discharged to:: Private residence Living Arrangements: Non-relatives/Friends;Children (Pt's son has CP. Has 24/7 nurses.) Available Help at Discharge: Personal care attendant;Available 24 hours/day Type of Home: House Home Access: Level entry     Home Layout: One level Home Equipment: Walker - 2 wheels;Wheelchair - manual;Bedside commode      Prior Function Level of Independence: Independent         Comments: Pt reports having 24/7 care from hired nurses to help care for son with CP.     Hand Dominance        Extremity/Trunk Assessment   Upper Extremity Assessment: Defer to OT evaluation  Lower Extremity Assessment: RLE deficits/detail RLE Deficits / Details: Limited AROM hip/knee secondary to surgery.        Communication   Communication: No difficulties  Cognition Arousal/Alertness: Lethargic Behavior During Therapy: WFL for tasks assessed/performed Overall Cognitive Status: Within Functional Limits for tasks assessed                       General Comments      Exercises Total Joint Exercises Ankle Circles/Pumps: Both;15 reps;Supine Quad Sets: Right;5 reps;Supine Heel Slides: Right;5 reps;Seated Long Arc Quad: AAROM;Right;5 reps Goniometric ROM: -15-78 degrees knee AROM      Assessment/Plan    PT Assessment Patient needs continued PT services  PT Diagnosis Difficulty walking;Acute pain;Generalized weakness   PT Problem List Decreased strength;Pain;Decreased range of motion;Decreased cognition;Decreased activity tolerance;Decreased balance;Decreased mobility  PT Treatment Interventions Balance training;Gait training;Functional mobility training;Therapeutic activities;Therapeutic exercise;Patient/family education;DME instruction   PT Goals (Current goals can be found in the Care Plan section) Acute Rehab PT Goals Patient Stated Goal: to go home as soon as possible PT Goal Formulation: With patient Time For Goal Achievement: 10/24/14 Potential to Achieve Goals: Fair    Frequency 7X/week   Barriers to discharge Decreased caregiver support Not sure if pt's nurses will be able to provide care for patient as well as son??    Co-evaluation               End of Session Equipment Utilized During Treatment: Gait belt Activity Tolerance: Patient limited by lethargy Patient left: in chair;with call bell/phone within reach Nurse Communication: Mobility status         Time: [id number] PT Time Calculation (min) (ACUTE ONLY): 24 min   Charges:   PT Evaluation $Initial PT Evaluation Tier I: 1 Procedure PT Treatments $Therapeutic Activity: 8-22 mins   PT G Codes:        Ayannah Faddis A Thursa Emme 10/10/2014, 10:31 AM Mylo RedShauna Izsak Meir, PT, DPT [phone]

## 2014-10-10 NOTE — Op Note (Signed)
TOTAL KNEE REPLACEMENT OPERATIVE NOTE:  10/09/2014  12:32 PM  PATIENT:  Stephen Parks  74 y.o. male  PRE-OPERATIVE DIAGNOSIS:  Primary osteoarthritis right knee  POST-OPERATIVE DIAGNOSIS:  Primary osteoarthritis right knee  PROCEDURE:  Procedure(s): RIGHT TOTAL KNEE ARTHROPLASTY  SURGEON:  Surgeon(s): Dannielle Huh, MD  PHYSICIAN ASSISTANT: Altamese Cabal, [facility]  ANESTHESIA:   spinal  DRAINS: Hemovac  SPECIMEN: None  COUNTS:  Correct  TOURNIQUET:   Total Tourniquet Time Documented: Thigh (Right) - 49 minutes Total: Thigh (Right) - 49 minutes   DICTATION:  Indication for procedure:    The patient is a 74 y.o. male who has failed conservative treatment for Primary osteoarthritis right knee.  Informed consent was obtained prior to anesthesia. The risks versus benefits of the operation were explain and in a way the patient can, and did, understand.   On the implant demand matching protocol, this patient scored 10.  Therefore, this patient did" "did not receive a polyethylene insert with vitamin E which is a high demand implant.  Description of procedure:     The patient was taken to the operating room and placed under anesthesia.  The patient was positioned in the usual fashion taking care that all body parts were adequately padded and/or protected.  I foley catheter was not placed.  A tourniquet was applied and the leg prepped and draped in the usual sterile fashion.  The extremity was exsanguinated with the esmarch and tourniquet inflated to 350 mmHg.  Pre-operative range of motion was normal.  The knee was in 5 degree of mild valgus.  A midline incision approximately 6-7 inches long was made with a #10 blade.  A new blade was used to make a parapatellar arthrotomy going 2-3 cm into the quadriceps tendon, over the patella, and alongside the medial aspect of the patellar tendon.  A synovectomy was then performed with the #10 blade and forceps. I then elevated the deep MCL off  the medial tibial metaphysis subperiosteally around to the semimembranosus attachment.    I everted the patella and used calipers to measure patellar thickness.  I used the reamer to ream down to appropriate thickness to recreate the native thickness.  I then removed excess bone with the rongeur and sagittal saw.  I used the appropriately sized template and drilled the three lug holes.  I then put the trial in place and measured the thickness with the calipers to ensure recreation of the native thickness.  The trial was then removed and the patella subluxed and the knee brought into flexion.  A homan retractor was place to retract and protect the patella and lateral structures.  A Z-retractor was place medially to protect the medial structures.  The extra-medullary alignment system was used to make cut the tibial articular surface perpendicular to the anamotic axis of the tibia and in 3 degrees of posterior slope.  The cut surface and alignment jig was removed.  I then used the intramedullary alignment guide to make a 5 valgus cut on the distal femur.  I then marked out the epicondylar axis on the distal femur.  The posterior condylar axis measured 5 degrees.  I then used the anterior referencing sizer and measured the femur to be a size 9.  The 4-In-1 cutting block was screwed into place in external rotation matching the posterior condylar angle, making our cuts perpendicular to the epicondylar axis.  Anterior, posterior and chamfer cuts were made with the sagittal saw.  The cutting block and cut  pieces were removed.  A lamina spreader was placed in 90 degrees of flexion.  The ACL, PCL, menisci, and posterior condylar osteophytes were removed.  A 10 mm spacer blocked was found to offer good flexion and extension gap balance after minimal in degree releasing.   The scoop retractor was then placed and the femoral finishing block was pinned in place.  The small sagittal saw was used as well as the lug drill to  finish the femur.  The block and cut surfaces were removed and the medullary canal hole filled with autograft bone from the cut pieces.  The tibia was delivered forward in deep flexion and external rotation.  A size E tray was selected and pinned into place centered on the medial 1/3 of the tibial tubercle.  The reamer and keel was used to prepare the tibia through the tray.    I then trialed with the size 9 femur, size E tibia, a 10 mm insert and the 32 patella.  I had excellent flexion/extension gap balance, excellent patella tracking.  Flexion was full and beyond 120 degrees; extension was zero.  These components were chosen and the staff opened them to me on the back table while the knee was lavaged copiously and the cement mixed.  The soft tissue was infiltrated with 60cc of exparel 1.3% through a 21 gauge needle.  I cemented in the components and removed all excess cement.  The polyethylene tibial component was snapped into place and the knee placed in extension while cement was hardening.  The capsule was infilltrated with 30cc of .25% Marcaine with epinephrine.  A hemovac was place in the joint exiting superolaterally.  A pain pump was place superomedially superficial to the arthrotomy.  Once the cement was hard, the tourniquet was let down.  Hemostasis was obtained.  The arthrotomy was closed with figure-8 #1 vicryl sutures.  The deep soft tissues were closed with #0 vicryls and the subcuticular layer closed with a running #2-0 vicryl.  The skin was reapproximated and closed with skin staples.  The wound was dressed with xeroform, 4 x4's, 2 ABD sponges, a single layer of webril and a TED stocking.   The patient was then awakened, extubated, and taken to the recovery room in stable condition.  BLOOD LOSS:  300cc DRAINS: 1 hemovac, 1 pain catheter COMPLICATIONS:  None.  PLAN OF CARE: Admit to inpatient   PATIENT DISPOSITION:  PACU - hemodynamically stable.   Delay start of Pharmacological  VTE agent (>24hrs) due to surgical blood loss or risk of bleeding:  not applicable  Please fax a copy of this op note to my office at [phone] (please only include page 1 and 2 of the Case Information op note)

## 2014-10-10 NOTE — Progress Notes (Signed)
Patient unable to void .Badder scanned for 806 cc of urine.Patient asking for foley catheter to be placed refusing lab draw at this time.Call placed to Dr.Murphy.New order received for foley catheter to be placed tonight.

## 2014-10-10 NOTE — Progress Notes (Signed)
Orthopedic Tech Progress Note Patient Details:  Wandra Feinsteinelson F Frenkel April 14, 1941 [id number] On cpm at 7:00 pm Patient ID: Wandra Feinsteinelson F Muller, male   DOB: April 14, 1941, 74 y.o.   MRN: [id number]   Jennye MoccasinHughes, Cyara Devoto Craig 10/10/2014, 6:58 PM

## 2014-10-10 NOTE — Progress Notes (Signed)
Physical Therapy Treatment Patient Details Name: Stephen Parks F Waters MRN: [id number] DOB: May 22, 1940 Today's Date: 10/10/2014    History of Present Illness Patient is a 74 y/o male s/p R TKA. PMH of HTN, gout and DM.    PT Comments    Patient progressing slowly towards PT goals. Improved ambulation distance with encouragement. Continues to require assist to stand esp from low surfaces. Reviewed HEP. Will continue to follow and progress to maximize independence.    Follow Up Recommendations  Home health PT;Supervision/Assistance - 24 hour     Equipment Recommendations  None recommended by PT    Recommendations for Other Services       Precautions / Restrictions Precautions Precautions: Knee;Fall Precaution Booklet Issued: Yes (comment) Precaution Comments: Reviewed no pillow under knee and HEP. Restrictions Weight Bearing Restrictions: Yes RLE Weight Bearing: Weight bearing as tolerated    Mobility  Bed Mobility Overal bed mobility: Needs Assistance Bed Mobility: Supine to Sit     Supine to sit: HOB elevated;Supervision Sit to supine: Supervision   General bed mobility comments: Pt able to manage BLEs into bed. No cues needed, supervision for safety.  Transfers Overall transfer level: Needs assistance Equipment used: Rolling walker (2 wheeled) Transfers: Sit to/from Stand Sit to Stand: Mod assist;From elevated surface         General transfer comment: Mod A to rise from EOB with cues for hand placement, anterior translation and safety. Transferred to chair post ambulation bout.  Ambulation/Gait Ambulation/Gait assistance: Min guard Ambulation Distance (Feet): 100 Feet Assistive device: Rolling walker (2 wheeled) Gait Pattern/deviations: Step-to pattern;Decreased stance time - right;Decreased step length - left;Trunk flexed;Antalgic   Gait velocity interpretation: Below normal speed for age/gender General Gait Details: Increased WB through BUEs. Emphasized heel  strike through RLE.    Stairs            Wheelchair Mobility    Modified Rankin (Stroke Patients Only)       Balance Overall balance assessment: Needs assistance Sitting-balance support: Feet supported;No upper extremity supported Sitting balance-Leahy Scale: Good     Standing balance support: During functional activity Standing balance-Leahy Scale: Fair                      Cognition Arousal/Alertness: Awake/alert Behavior During Therapy: WFL for tasks assessed/performed Overall Cognitive Status: Within Functional Limits for tasks assessed                      Exercises Total Joint Exercises Ankle Circles/Pumps: Both;15 reps;Supine Quad Sets: Right;5 reps;Seated (with towel roll under ankle.) Long Arc Quad: AAROM;Right;10 reps Knee Flexion: Right;5 reps;Seated    General Comments        Pertinent Vitals/Pain Pain Assessment: 0-10 Pain Score: 4  Pain Location: right knee Pain Descriptors / Indicators: Sore;Aching Pain Intervention(s): Monitored during session;Premedicated before session;Repositioned;Limited activity within patient's tolerance    Home Living Family/patient expects to be discharged to:: Private residence Living Arrangements: Non-relatives/Friends;Children Available Help at Discharge: Personal care attendant;Available 24 hours/day Type of Home: House Home Access: Level entry   Home Layout: One level Home Equipment: Walker - 2 wheels;Wheelchair - manual;Bedside commode      Prior Function Level of Independence: Independent      Comments: Pt reports having 24/7 care from hired nurses to help care for son with CP.   PT Goals (current goals can now be found in the care plan section) Acute Rehab PT Goals Patient Stated Goal: to go home  as soon as possible Progress towards PT goals: Progressing toward goals    Frequency  7X/week    PT Plan Current plan remains appropriate    Co-evaluation             End of  Session Equipment Utilized During Treatment: Gait belt Activity Tolerance: Patient tolerated treatment well;Patient limited by pain Patient left: in chair;with call bell/phone within reach     Time: 1535-1600 PT Time Calculation (min) (ACUTE ONLY): 25 min  Charges:  $Gait Training: 8-22 mins $Therapeutic Exercise: 8-22 mins                    G Codes:      Macalister Arnaud A Narcissus Detwiler 10/10/2014, 4:33 PM  Mylo Red, PT, DPT [phone]

## 2014-10-10 NOTE — Progress Notes (Signed)
SPORTS MEDICINE AND JOINT REPLACEMENT  Georgena SpurlingStephen Lucey, MD   Altamese CabalMaurice Geraldene Eisel, PA-C [address], [place], [place]  [address]                             [phone]   PROGRESS NOTE  Subjective:  negative for Chest Pain  negative for Shortness of Breath  negative for Nausea/Vomiting   negative for Calf Pain  negative for Bowel Movement   Tolerating Diet: yes         Patient reports pain as 8 on 0-10 scale.    Objective: Vital signs in last 24 hours:   Patient Vitals for the past 24 hrs:  BP Temp Temp src Pulse Resp SpO2  10/10/14 0505 (!) 148/57 mmHg 98.3 F (36.8 C) Oral 91 16 97 %  10/10/14 0400 - - - - - 95 %  10/10/14 0051 (!) 152/66 mmHg 98.4 F (36.9 C) Oral 98 18 96 %  10/10/14 0030 - - - - 16 94 %  10/09/14 2108 (!) 153/55 mmHg 98.9 F (37.2 C) Oral 98 18 96 %  10/09/14 1819 (!) 150/47 mmHg 98.2 F (36.8 C) - 86 16 98 %  10/09/14 1630 (!) 155/66 mmHg 98.1 F (36.7 C) - 92 16 98 %  10/09/14 1530 (!) 147/71 mmHg - - 80 12 96 %  10/09/14 1430 (!) 144/60 mmHg - - 76 12 97 %  10/09/14 1330 129/60 mmHg - - 67 (!) 23 99 %  10/09/14 1315 (!) 138/55 mmHg - - 67 13 98 %  10/09/14 1300 (!) 127/57 mmHg - - 68 15 97 %  10/09/14 1245 (!) 135/59 mmHg - - 65 16 98 %  10/09/14 1230 (!) 141/51 mmHg - - 65 15 98 %  10/09/14 1215 (!) 132/46 mmHg - - 68 18 96 %  10/09/14 1200 (!) 137/49 mmHg - - 70 13 90 %    @flow {1959:LAST@   Intake/Output from previous day:   05/23 0701 - 05/24 0700 In: 1896.3 [I.V.:1896.3] Out: 1440 [Urine:1400]   Intake/Output this shift:   05/24 0701 - 05/24 1900 In: -  Out: 300 [Urine:300]   Intake/Output      05/23 0701 - 05/24 0700 05/24 0701 - 05/25 0700   I.V. (mL/kg) 1896.3 (17.9)    Total Intake(mL/kg) 1896.3 (17.9)    Urine (mL/kg/hr) 1400 300 (0.6)   Blood 40    Total Output 1440 300   Net +456.3 -300           LABORATORY DATA:  Recent Labs  10/10/14 0520  WBC 9.3  HGB 12.7*  HCT 37.6*  PLT 248    Recent Labs  10/10/14 0520  NA 134*  K 4.2  CL 100*  CO2 26  BUN 16  CREATININE 1.44*  GLUCOSE 216*  CALCIUM 8.5*   Lab Results  Component Value Date   INR 1.04 09/29/2014    Examination:  General appearance: alert, cooperative and no distress Extremities: Homans sign is negative, no sign of DVT  Wound Exam: clean, dry, intact   Drainage:  None: wound tissue dry  Motor Exam: EHL and FHL Intact  Sensory Exam: Deep Peroneal normal   Assessment:    1 Day Post-Op  Procedure(s) (LRB): RIGHT TOTAL KNEE ARTHROPLASTY (Right)  ADDITIONAL DIAGNOSIS:  Active Problems:   S/P total knee arthroplasty  Acute Blood Loss Anemia   Plan: Physical Therapy as ordered Weight Bearing as Tolerated (WBAT)  DVT Prophylaxis:  Lovenox  DISCHARGE PLAN: Home  DISCHARGE NEEDS: HHPT, CPM, Walker and 3-in-1 comode seat         Stephen Parks 10/10/2014, 11:52 AM

## 2014-10-11 LAB — CBC
HCT: 32 % — ABNORMAL LOW (ref 39.0–52.0)
Hemoglobin: 11.3 g/dL — ABNORMAL LOW (ref 13.0–17.0)
MCH: 32.1 pg (ref 26.0–34.0)
MCHC: 35.3 g/dL (ref 30.0–36.0)
MCV: 90.9 fL (ref 78.0–100.0)
Platelets: 211 10*3/uL (ref 150–400)
RBC: 3.52 MIL/uL — AB (ref 4.22–5.81)
RDW: 12.6 % (ref 11.5–15.5)
WBC: 10.2 10*3/uL (ref 4.0–10.5)

## 2014-10-11 LAB — HEMOGLOBIN A1C
Hgb A1c MFr Bld: 9.8 % — ABNORMAL HIGH (ref 4.8–5.6)
Mean Plasma Glucose: 235 mg/dL

## 2014-10-11 LAB — GLUCOSE, CAPILLARY
Glucose-Capillary: 181 mg/dL — ABNORMAL HIGH (ref 65–99)
Glucose-Capillary: 258 mg/dL — ABNORMAL HIGH (ref 65–99)

## 2014-10-11 MED ORDER — OXYCODONE HCL 10 MG PO TABS: 10.0000 mg | ORAL_TABLET | Freq: Two times a day (BID) | ORAL | Status: AC

## 2014-10-11 MED ORDER — METHOCARBAMOL 500 MG PO TABS
500.0000 mg | ORAL_TABLET | Freq: Four times a day (QID) | ORAL | Status: AC | PRN
Start: 2014-10-11 — End: ?

## 2014-10-11 MED ORDER — OXYCODONE HCL 5 MG PO TABS: 5.0000 mg | ORAL_TABLET | ORAL | Status: AC | PRN

## 2014-10-11 NOTE — Discharge Instructions (Signed)

## 2014-10-11 NOTE — Progress Notes (Signed)
SPORTS MEDICINE AND JOINT REPLACEMENT  Stephen SpurlingStephen Lucey, MD   Stephen CabalMaurice Kolston Lacount, PA-C [address], [place], [place]  [address]                             [phone]   PROGRESS NOTE  Subjective:  negative for Chest Pain  negative for Shortness of Breath  negative for Nausea/Vomiting   negative for Calf Pain  negative for Bowel Movement   Tolerating Diet: yes         Patient reports pain as 5 on 0-10 scale.    Objective: Vital signs in last 24 hours:   Patient Vitals for the past 24 hrs:  BP Temp Temp src Pulse Resp SpO2  10/11/14 1300 (!) 161/72 mmHg 98.8 F (37.1 C) - 86 16 94 %  10/11/14 0545 (!) 147/56 mmHg 98.2 F (36.8 C) - 92 18 91 %  10/10/14 1923 (!) 162/66 mmHg 98.6 F (37 C) Oral 98 18 92 %    @flow {1959:LAST@   Intake/Output from previous day:   05/24 0701 - 05/25 0700 In: 360 [P.O.:360] Out: 1425 [Urine:1425]   Intake/Output this shift:   05/25 0701 - 05/25 1900 In: 240 [P.O.:240] Out: 400 [Urine:400]   Intake/Output      05/24 0701 - 05/25 0700 05/25 0701 - 05/26 0700   P.O. 360 240   I.V. (mL/kg)     Total Intake(mL/kg) 360 (3.4) 240 (2.3)   Urine (mL/kg/hr) 1425 (0.6) 400 (0.5)   Blood     Total Output 1425 400   Net -1065 -160           LABORATORY DATA:  Recent Labs  10/10/14 0520 10/11/14 0501  WBC 9.3 10.2  HGB 12.7* 11.3*  HCT 37.6* 32.0*  PLT 248 211    Recent Labs  10/10/14 0520  NA 134*  K 4.2  CL 100*  CO2 26  BUN 16  CREATININE 1.44*  GLUCOSE 216*  CALCIUM 8.5*   Lab Results  Component Value Date   INR 1.04 09/29/2014    Examination:  General appearance: alert, cooperative and no distress Extremities: extremities normal, atraumatic, no cyanosis or edema and Homans sign is negative, no sign of DVT  Wound Exam: clean, dry, intact   Drainage:  None: wound tissue dry  Motor Exam: EHL and FHL Intact  Sensory Exam: Deep Peroneal normal   Assessment:    2 Days Post-Op  Procedure(s) (LRB): RIGHT  TOTAL KNEE ARTHROPLASTY (Right)  ADDITIONAL DIAGNOSIS:  Active Problems:   S/P total knee arthroplasty  Acute Blood Loss Anemia   Plan: Physical Therapy as ordered Weight Bearing as Tolerated (WBAT)  DVT Prophylaxis:  Lovenox  DISCHARGE PLAN: Home  DISCHARGE NEEDS: HHPT, CPM, Walker and 3-in-1 comode seat         Stephen Parks 10/11/2014, 2:20 PM

## 2014-10-11 NOTE — Progress Notes (Signed)
Pt discharged to home via wheelchair without incident with son. D/C teachings done, Pt verb understanding and agrees to comply.

## 2014-10-11 NOTE — Progress Notes (Signed)
Physical Therapy Treatment Patient Details Name: Stephen Parks MRN: [id number] DOB: 1940/10/10 Today's Date: 10/11/2014    History of Present Illness Patient is a 74 y/o male s/p R TKA. PMH of HTN, gout and DM.    PT Comments    Patient progressing well towards PT goals. Improved ambulation distance. Eager to return home. Reviewed HEP. Will continue to follow if still in hospital to maximize independence. Continues to be limited by pain.   Follow Up Recommendations  Home health PT;Supervision/Assistance - 24 hour     Equipment Recommendations  None recommended by PT    Recommendations for Other Services       Precautions / Restrictions Precautions Precautions: Knee;Fall Precaution Booklet Issued: Yes (comment) Precaution Comments: Reviewed no pillow under knee and HEP. Restrictions Weight Bearing Restrictions: Yes RLE Weight Bearing: Weight bearing as tolerated    Mobility  Bed Mobility               General bed mobility comments: Sitting in chair upon PT arrival.   Transfers Overall transfer level: Needs assistance Equipment used: Rolling walker (2 wheeled) Transfers: Sit to/from Stand Sit to Stand: Min guard         General transfer comment: Min guard for safety. Good demo of technique.  Ambulation/Gait Ambulation/Gait assistance: Min guard Ambulation Distance (Feet): 120 Feet Assistive device: Rolling walker (2 wheeled) Gait Pattern/deviations: Step-to pattern;Decreased stance time - right;Decreased step length - left;Trunk flexed;Antalgic   Gait velocity interpretation: Below normal speed for age/gender General Gait Details: Increased WB through BUEs. Emphasized heel strike through RLE.    Stairs            Wheelchair Mobility    Modified Rankin (Stroke Patients Only)       Balance Overall balance assessment: Needs assistance Sitting-balance support: Feet supported;No upper extremity supported Sitting balance-Leahy Scale: Good      Standing balance support: During functional activity Standing balance-Leahy Scale: Fair                      Cognition Arousal/Alertness: Awake/alert Behavior During Therapy: WFL for tasks assessed/performed Overall Cognitive Status: Within Functional Limits for tasks assessed                      Exercises Total Joint Exercises Ankle Circles/Pumps: Both;15 reps;Supine Knee Flexion: Right;Seated;5 reps Goniometric ROM: -10-78 degrees knee AROM.    General Comments        Pertinent Vitals/Pain Pain Assessment: Faces Pain Score: 4  Faces Pain Scale: Hurts little more Pain Location: right knee Pain Descriptors / Indicators: Sore;Aching Pain Intervention(s): Monitored during session;Repositioned    Home Living                      Prior Function            PT Goals (current goals can now be found in the care plan section) Acute Rehab PT Goals Patient Stated Goal: to go home as soon as possible Progress towards PT goals: Progressing toward goals    Frequency  7X/week    PT Plan Current plan remains appropriate    Co-evaluation             End of Session Equipment Utilized During Treatment: Gait belt;Right knee immobilizer (Pt declined wearing KI.) Activity Tolerance: Patient tolerated treatment well Patient left: in chair;with call bell/phone within reach     Time: 7829-5621 PT Time Calculation (min) (ACUTE ONLY): 18  min  Charges:  $Gait Training: 8-22 mins                    G Codes:      Marcy PanningShauna A Hansini Clodfelter 10/11/2014, 10:15 AM  Mylo RedShauna Normon Pettijohn, PT, DPT [phone]

## 2014-10-11 NOTE — Progress Notes (Signed)
Pt able to return demonstrate proper administration of Lovenox. Pt states, "I have given myself shots before".

## 2014-10-11 NOTE — Progress Notes (Signed)
Occupational Therapy Treatment Patient Details Name: Stephen Parks MRN: [id number] DOB: [birth year] Today's Date: 10/11/2014    History of present illness Patient is a 74 y/o male s/p R TKA. PMH of HTN, gout and DM.   OT comments  Completed education regarding use of compensatory techniques and AE for ADL. Pt ready to d/c home with intitial 24/7 S when medically stable. OT goals met/adequate for D/C. OT signing off.   Follow Up Recommendations  No OT follow up;Supervision/Assistance - 24 hour    Equipment Recommendations  Other (comment) (AE)    Recommendations for Other Services      Precautions / Restrictions Precautions Precautions: Knee;Fall              ADL                                         General ADL Comments: Educated pt on use of sock aid. long handled sponge and reacher for bathing/dressing. Pt verbalized understanding. Pt very appreciative of information. discussed proper positioning of R knee and use of ice for pain control                                       Cognition   Behavior During Therapy: WFL for tasks assessed/performed Overall Cognitive Status: Within Functional Limits for tasks assessed                       Pertinent Vitals/ Pain       Pain Assessment: 0-10 Pain Score: 4  Pain Location: R knee Pain Descriptors / Indicators: Sore;Aching Pain Intervention(s): Monitored during session;Repositioned;Ice applied  Home Living                                          Prior Functioning/Environment              Frequency       Progress Toward Goals  OT Goals(current goals can now be found in the care plan section)  Progress towards OT goals: Goals met/education completed, patient discharged from OT (adequate for D/C)  Acute Rehab OT Goals Patient Stated Goal: to go home as soon as possible OT Goal Formulation: With patient Time For Goal Achievement: 10/17/14 Potential  to Achieve Goals: Good ADL Goals Pt Will Perform Grooming: with modified independence;standing Pt Will Perform Lower Body Bathing: with modified independence;sit to/from stand;with adaptive equipment Pt Will Perform Lower Body Dressing: with modified independence;sit to/from stand;with adaptive equipment Pt Will Transfer to Toilet: with modified independence;ambulating;bedside commode Pt Will Perform Toileting - Clothing Manipulation and hygiene: with modified independence;sit to/from stand Pt Will Perform Tub/Shower Transfer: with modified independence;shower seat;rolling walker;ambulating;Shower transfer  Plan Discharge plan remains appropriate    Co-evaluation                 End of Session     Activity Tolerance Patient tolerated treatment well   Patient Left in chair;with call bell/phone within reach   Nurse Communication Mobility status        Time: 1324-4010 OT Time Calculation (min): 23 min  Charges: OT General Charges $OT Visit: 1 Procedure OT Treatments $Self Care/Home Management : 23-37 mins  Nioka Thorington,HILLARY 10/11/2014, 10:09 AM   Maurie Boettcher, OTR/L  [phone] 10/11/2014

## 2014-10-23 NOTE — Discharge Summary (Signed)
SPORTS MEDICINE & JOINT REPLACEMENT   Stephen Spurling, MD   Stephen Cabal, PA-C [address] [place], [place], [place]  [address]                             [phone]  PATIENT ID: Stephen Parks        MRN:  [id number]          DOB/AGE: 12/10/1940 / 74 y.o.    DISCHARGE SUMMARY  ADMISSION DATE:    10/09/2014 DISCHARGE DATE:   10/11/2014  ADMISSION DIAGNOSIS: Primary osteoarthritis right knee    DISCHARGE DIAGNOSIS:  Primary osteoarthritis right knee    ADDITIONAL DIAGNOSIS: Active Problems:   S/P total knee arthroplasty  Past Medical History  Diagnosis Date  . Stomach ulcer   . Snoring   . Hypertension   . Diabetes mellitus without complication     Type 2  . Gout   . Arthritis     PROCEDURE: Procedure(s): RIGHT TOTAL KNEE ARTHROPLASTY on 10/09/2014  CONSULTS:     HISTORY:  See H&P in chart  HOSPITAL COURSE:  Stephen Parks is a 74 y.o. admitted on 10/09/2014 and found to have a diagnosis of Primary osteoarthritis right knee.  After appropriate laboratory studies were obtained  they were taken to the operating room on 10/09/2014 and underwent Procedure(s): RIGHT TOTAL KNEE ARTHROPLASTY.   They were given perioperative antibiotics:  Anti-infectives    Start     Dose/Rate Route Frequency Ordered Stop   10/09/14 1645  ceFAZolin (ANCEF) IVPB 2 g/50 mL premix     2 g 100 mL/hr over 30 Minutes Intravenous Every 6 hours 10/09/14 1636 10/09/14 2234   10/09/14 0741  ceFAZolin (ANCEF) IVPB 2 g/50 mL premix     2 g 100 mL/hr over 30 Minutes Intravenous On call to O.R. 10/09/14 2130 10/09/14 8657    .  Tolerated the procedure well.  Placed with a foley intraoperatively.  Given Ofirmev at induction and for 48 hours.    POD# 1: Vital signs were stable.  Patient denied Chest pain, shortness of breath, or calf pain.  Patient was started on Lovenox 30 mg subcutaneously twice daily at 8am.  Consults to PT, OT, and care management were made.  The patient was weight bearing as  tolerated.  CPM was placed on the operative leg 0-90 degrees for 6-8 hours a day.  Incentive spirometry was taught.  Dressing was changed.  Hemovac was discontinued.      POD #2, Continued  PT for ambulation and exercise program.  IV saline locked.  O2 discontinued. Patient urinating on his own    The remainder of the hospital course was dedicated to ambulation and strengthening.   The patient was discharged on day 2 post op in  Good condition.  Blood products given:none  DIAGNOSTIC STUDIES: Recent vital signs: No data found.      Recent laboratory studies: No results for input(s): WBC, HGB, HCT, PLT in the last 168 hours. No results for input(s): NA, K, CL, CO2, BUN, CREATININE, GLUCOSE, CALCIUM in the last 168 hours. Lab Results  Component Value Date   INR 1.04 09/29/2014     Recent Radiographic Studies :  Dg Chest 2 View  09/29/2014   CLINICAL DATA:  Preoperative evaluation for knee replacement  EXAM: CHEST  2 VIEW  COMPARISON:  None.  FINDINGS: The heart size and mediastinal contours are within normal limits. Both lungs are  clear. The visualized skeletal structures are unremarkable.  IMPRESSION: No active cardiopulmonary disease.   Electronically Signed   By: Alcide CleverMark  Lukens M.D.   On: 09/29/2014 15:25    DISCHARGE INSTRUCTIONS: Discharge Instructions    CPM    Complete by:  As directed   Continuous passive motion machine (CPM):      Use the CPM from 0 to 90 for 6-8 hours per day.      You may increase by 10 per day.  You may break it up into 2 or 3 sessions per day.      Use CPM for 2 weeks or until you are told to stop.     Call MD / Call 911    Complete by:  As directed   If you experience chest pain or shortness of breath, CALL 911 and be transported to the hospital emergency room.  If you develope a fever above 101 F, pus (white drainage) or increased drainage or redness at the wound, or calf pain, call your surgeon's office.     Change dressing    Complete by:  As  directed   Change dressing on wednesday, then change the dressing daily with sterile 4 x 4 inch gauze dressing and apply TED hose.     Constipation Prevention    Complete by:  As directed   Drink plenty of fluids.  Prune juice may be helpful.  You may use a stool softener, such as Colace (over the counter) 100 mg twice a day.  Use MiraLax (over the counter) for constipation as needed.     Diet - low sodium heart healthy    Complete by:  As directed      Do not put a pillow under the knee. Place it under the heel.    Complete by:  As directed      Driving restrictions    Complete by:  As directed   No driving for 6 weeks     Increase activity slowly as tolerated    Complete by:  As directed      Lifting restrictions    Complete by:  As directed   No lifting for 6 weeks     TED hose    Complete by:  As directed   Use stockings (TED hose) for 2 weeks on both leg(s).  You may remove them at night for sleeping.           DISCHARGE MEDICATIONS:     Medication List    TAKE these medications        amLODipine 2.5 MG tablet  Commonly known as:  NORVASC  Take 2.5 mg by mouth daily.     aspirin 81 MG tablet  Take 81 mg by mouth daily.     dutasteride 0.5 MG capsule  Commonly known as:  AVODART  Take 0.5 mg by mouth daily. Takes AVODART - NAME BRAND     glimepiride 2 MG tablet  Commonly known as:  AMARYL  Take 2 mg by mouth daily with breakfast.     hydrochlorothiazide 25 MG tablet  Commonly known as:  HYDRODIURIL  Take 25 mg by mouth daily.     metFORMIN 500 MG (MOD) 24 hr tablet  Commonly known as:  GLUMETZA  Take 500 mg by mouth daily with breakfast.     methocarbamol 500 MG tablet  Commonly known as:  ROBAXIN  Take 1-2 tablets (500-1,000 mg total) by mouth every 6 (six) hours as needed for  muscle spasms.     multivitamin with minerals tablet  Take 1 tablet by mouth daily.     omeprazole 40 MG capsule  Commonly known as:  PRILOSEC  Take 40 mg by mouth daily.      oxyCODONE 5 MG immediate release tablet  Commonly known as:  Oxy IR/ROXICODONE  Take 1-2 tablets (5-10 mg total) by mouth every 4 (four) hours as needed for breakthrough pain.     Oxycodone HCl 10 MG Tabs  Take 1 tablet (10 mg total) by mouth 2 (two) times daily.     probenecid 500 MG tablet  Commonly known as:  BENEMID  Take 500 mg by mouth daily.     quinapril 40 MG tablet  Commonly known as:  ACCUPRIL  Take 40 mg by mouth 2 (two) times daily.     simvastatin 10 MG tablet  Commonly known as:  ZOCOR  Take 10 mg by mouth daily.     Vitamin D (Cholecalciferol) 1000 UNITS Tabs  Take 1 tablet by mouth daily.        FOLLOW UP VISIT:       Follow-up Information    Follow up with [facility].   Why:  Someone from [facility] will contact you concerning start date and time for therapy 10/12/14 is date scheduled.   Contact information:   [phone]      Follow up with Raymon Mutton, MD. Call on 10/24/2014.   Specialty:  Orthopedic Surgery   Contact information:   [address] [place] [address] [phone]       DISPOSITION: HOME   CONDITION:  Good   Harleyquinn Gasser 10/23/2014, 10:03 AM

## 2016-05-09 IMAGING — CR DG CHEST 2V
2 series · 2 of 2 positions shown · non-contrast
Comparison: None.

CLINICAL DATA: Preoperative evaluation for knee replacement

EXAM:
CHEST  2 VIEW

[w chest pa]
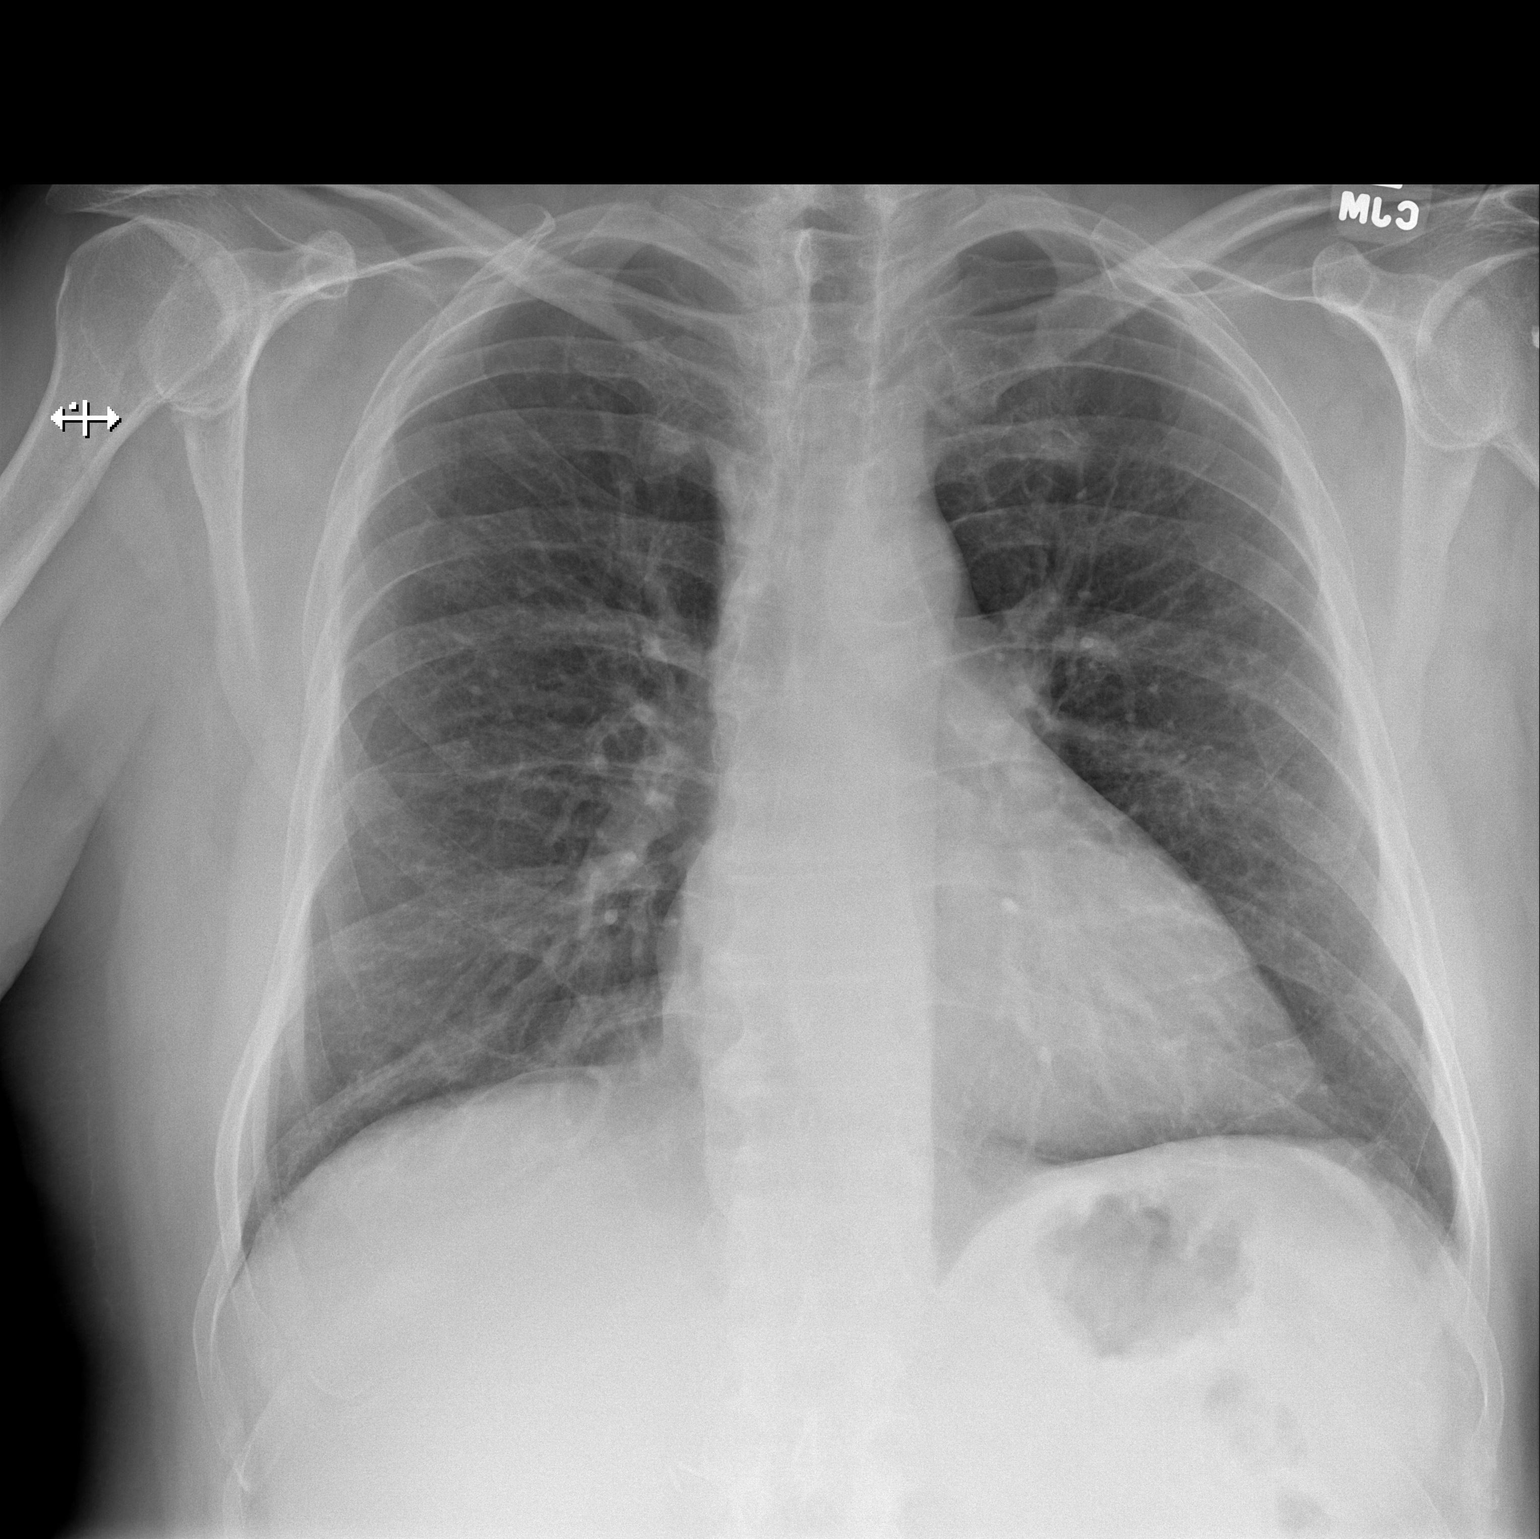

[w chest lat]
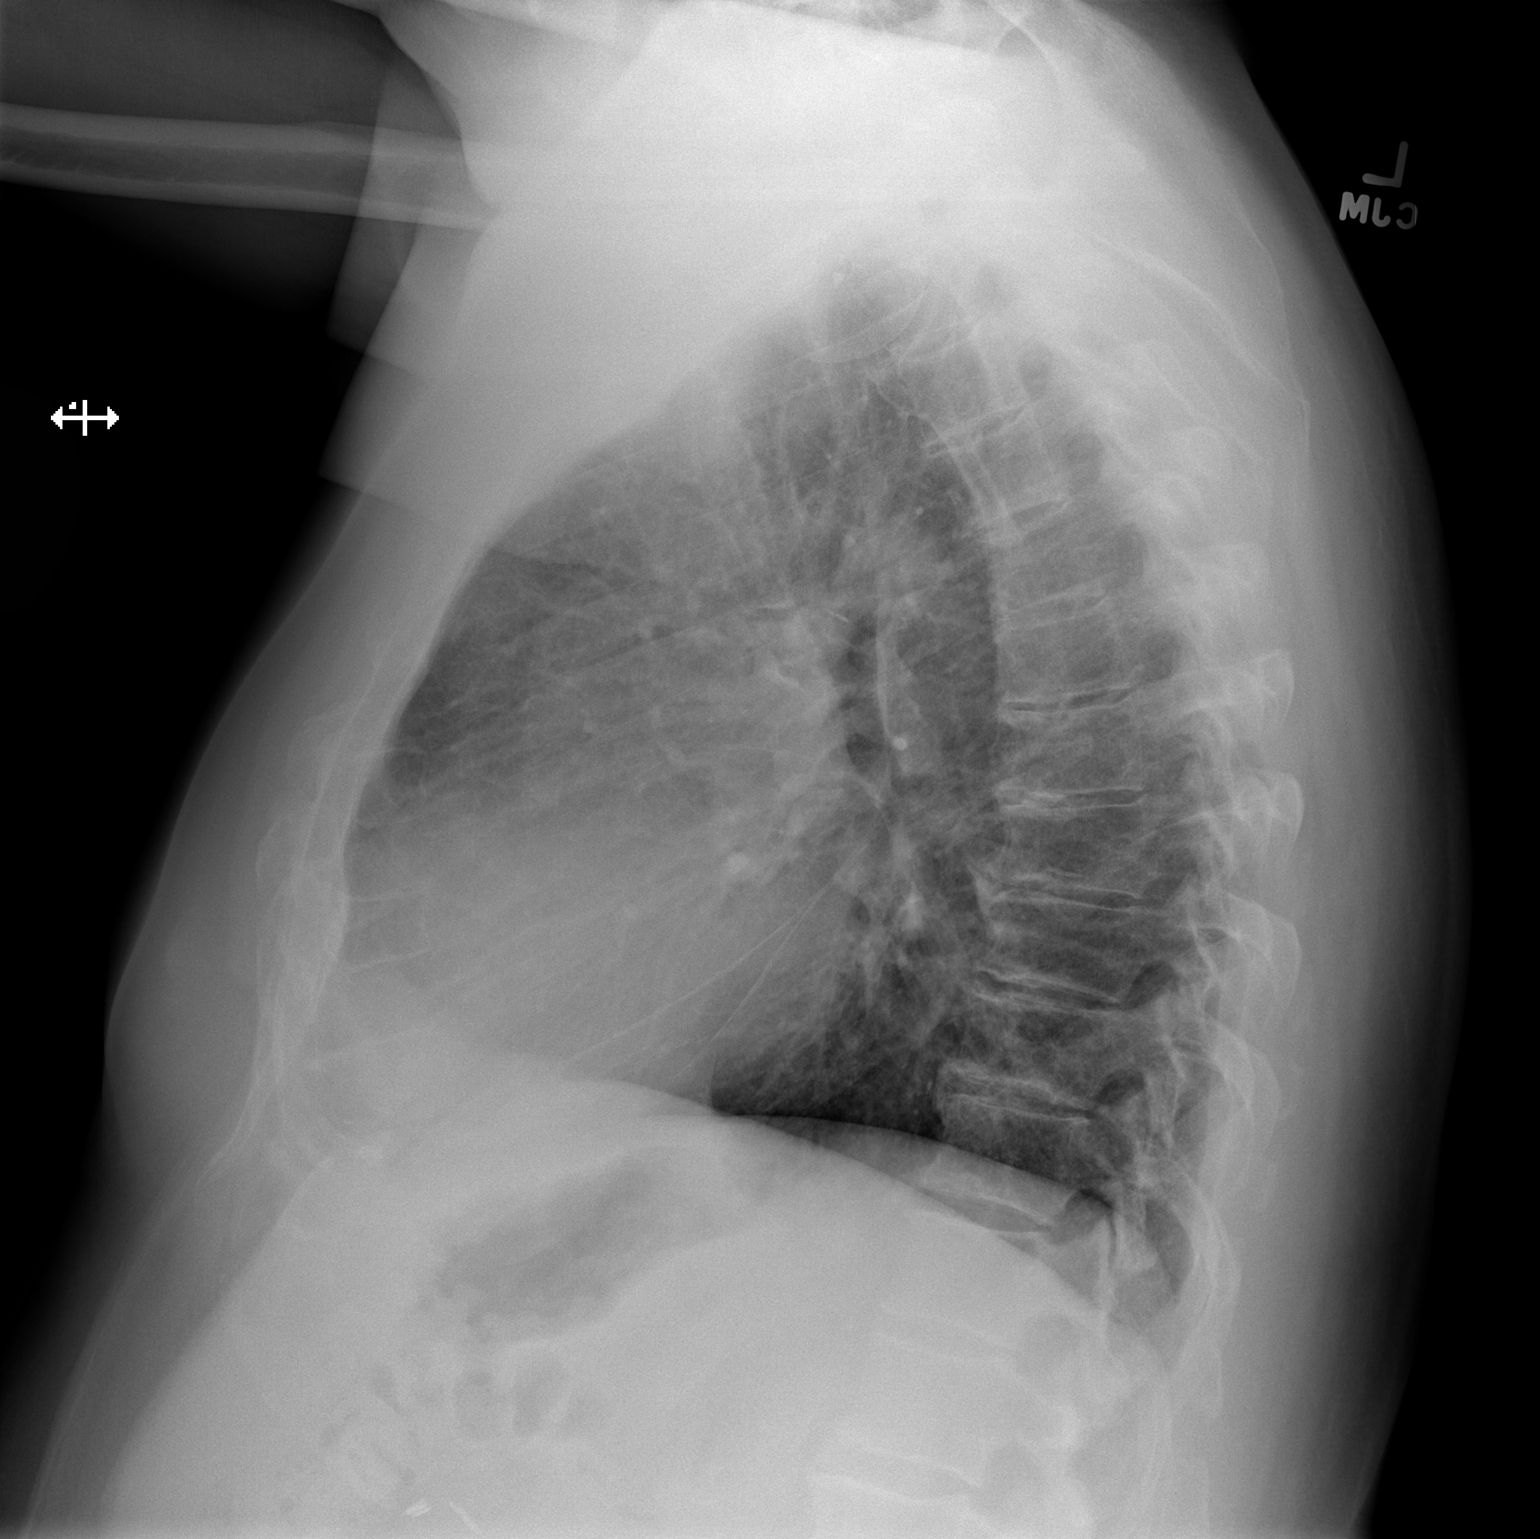

[2 of 2 positions shown; findings below may reference images not displayed]

FINDINGS: The heart size and mediastinal contours are within normal limits.
Both lungs are clear. The visualized skeletal structures are
unremarkable.
IMPRESSION: No active cardiopulmonary disease.
